# Patient Record
Sex: Female | Born: 1955 | Race: White | Hispanic: No | Marital: Married | Smoking: Former smoker
Health system: Southern US, Community
[De-identification: ages and names within clinical notes are randomized; demographics above are authoritative.]

## PROBLEM LIST (undated history)

## (undated) DIAGNOSIS — C801 Malignant (primary) neoplasm, unspecified: Secondary | ICD-10-CM

## (undated) DIAGNOSIS — F40240 Claustrophobia: Secondary | ICD-10-CM

## (undated) DIAGNOSIS — I10 Essential (primary) hypertension: Secondary | ICD-10-CM

## (undated) DIAGNOSIS — F419 Anxiety disorder, unspecified: Secondary | ICD-10-CM

## (undated) DIAGNOSIS — R011 Cardiac murmur, unspecified: Secondary | ICD-10-CM

## (undated) DIAGNOSIS — F329 Major depressive disorder, single episode, unspecified: Secondary | ICD-10-CM

## (undated) DIAGNOSIS — F32A Depression, unspecified: Secondary | ICD-10-CM

## (undated) DIAGNOSIS — K219 Gastro-esophageal reflux disease without esophagitis: Secondary | ICD-10-CM

## (undated) DIAGNOSIS — R7303 Prediabetes: Secondary | ICD-10-CM

## (undated) HISTORY — PX: ABLATION: SHX5711

## (undated) HISTORY — PX: TUBAL LIGATION: SHX77

## (undated) HISTORY — DX: Gastro-esophageal reflux disease without esophagitis: K21.9

---

## 2003-11-29 ENCOUNTER — Other Ambulatory Visit: Admission: RE | Admit: 2003-11-29 | Discharge: 2003-11-29 | Payer: Self-pay | Admitting: Obstetrics and Gynecology

## 2003-12-03 ENCOUNTER — Encounter: Admission: RE | Admit: 2003-12-03 | Discharge: 2003-12-03 | Payer: Self-pay | Admitting: Obstetrics and Gynecology

## 2005-06-14 ENCOUNTER — Other Ambulatory Visit: Admission: RE | Admit: 2005-06-14 | Discharge: 2005-06-14 | Payer: Self-pay | Admitting: Obstetrics and Gynecology

## 2006-05-13 ENCOUNTER — Encounter (INDEPENDENT_AMBULATORY_CARE_PROVIDER_SITE_OTHER): Payer: Self-pay | Admitting: *Deleted

## 2006-05-13 ENCOUNTER — Ambulatory Visit (HOSPITAL_COMMUNITY): Admission: RE | Admit: 2006-05-13 | Discharge: 2006-05-13 | Payer: Self-pay | Admitting: Obstetrics and Gynecology

## 2006-07-29 ENCOUNTER — Encounter: Admission: RE | Admit: 2006-07-29 | Discharge: 2006-07-29 | Payer: Self-pay | Admitting: Obstetrics and Gynecology

## 2006-12-31 HISTORY — PX: ABDOMINAL HYSTERECTOMY: SHX81

## 2007-09-02 ENCOUNTER — Encounter (INDEPENDENT_AMBULATORY_CARE_PROVIDER_SITE_OTHER): Payer: Self-pay | Admitting: Radiology

## 2007-09-02 ENCOUNTER — Other Ambulatory Visit: Admission: RE | Admit: 2007-09-02 | Discharge: 2007-09-02 | Payer: Self-pay | Admitting: Obstetrics and Gynecology

## 2007-09-02 ENCOUNTER — Encounter: Admission: RE | Admit: 2007-09-02 | Discharge: 2007-09-02 | Payer: Self-pay | Admitting: Obstetrics and Gynecology

## 2007-10-15 ENCOUNTER — Encounter: Admission: RE | Admit: 2007-10-15 | Discharge: 2007-10-15 | Payer: Self-pay | Admitting: Obstetrics and Gynecology

## 2007-12-09 ENCOUNTER — Ambulatory Visit (HOSPITAL_COMMUNITY): Admission: RE | Admit: 2007-12-09 | Discharge: 2007-12-11 | Payer: Self-pay | Admitting: Obstetrics and Gynecology

## 2007-12-09 ENCOUNTER — Encounter (INDEPENDENT_AMBULATORY_CARE_PROVIDER_SITE_OTHER): Payer: Self-pay | Admitting: Obstetrics and Gynecology

## 2008-08-30 ENCOUNTER — Encounter: Admission: RE | Admit: 2008-08-30 | Discharge: 2008-08-30 | Payer: Self-pay | Admitting: Obstetrics and Gynecology

## 2008-09-01 ENCOUNTER — Encounter: Admission: RE | Admit: 2008-09-01 | Discharge: 2008-09-01 | Payer: Self-pay | Admitting: Obstetrics and Gynecology

## 2009-04-13 ENCOUNTER — Encounter: Admission: RE | Admit: 2009-04-13 | Discharge: 2009-04-13 | Payer: Self-pay | Admitting: Obstetrics and Gynecology

## 2009-12-31 HISTORY — PX: KNEE ARTHROSCOPY: SHX127

## 2009-12-31 HISTORY — PX: STAPEDECTOMY: SHX2435

## 2011-05-15 NOTE — Op Note (Signed)
NAMEKENZEE, Beth Williams               ACCOUNT NO.:  [id number]   MEDICAL RECORD NO.:  [id number]          PATIENT TYPE:  INP   LOCATION:  9308                          FACILITY:  [facility]   PHYSICIAN:  Michelle L. Grewal, M.D.DATE OF BIRTH:  01/04/1956   DATE OF PROCEDURE:  12/09/2007  DATE OF DISCHARGE:                               OPERATIVE REPORT   PREOPERATIVE DIAGNOSIS:  Postmenopausal bleeding.   POSTOPERATIVE DIAGNOSIS:  Postmenopausal bleeding.   PROCEDURE:  Laparoscopically-assisted vaginal hysterectomy and bilateral  salpingo-oophorectomy.   SURGICAL HISTORY:  Marcelino Duster L. Vincente Poli, M.D.   ANESTHESIA:  General.   ASSISTANT:  Freddy Finner, M.D.   ESTIMATED BLOOD LOSS:  100 mL.   DRAINS:  Foley catheter.   PATHOLOGY:  Uterus with cervix, tubes and ovaries.   PROCEDURE:  The patient was taken to the operating room after informed  consent was obtained.  She was then prepped and draped in the usual  sterile fashion after she was intubated.  A Foley catheter was inserted.  The uterus was grasped with a uterine manipulator.  Attention was turned  to the abdomen, where a small infraumbilical incision was made, the  Veress needle was inserted and pneumoperitoneum was performed.  We then  removed the Veress needle and the 11-mm trocar was inserted.  The  laparoscope was introduced through the trocar sheath.  The entire pelvis  was inspected and noted to be normal with no adhesions.  The secondary 5-  mm trocar was placed under direct visualization in the suprapubic  region.  We then identified our ovaries, which appeared normal,  identified the ureter, which was well below the IP ligament on both  sides.  Grasped the ovary initially on the right side, lifted it up.  Using the Gyrus, I then placed it just beneath the ovary, burned the IP  ligament, and transected it.  I did this and went through the  mesosalpinx all the way through the round ligament.  This was done with  no  bleeding at all.  I did this on the left side of the pelvis in an  identical fashion.  After we did this portion of the surgery, we then  turned our attention vaginally.  Pneumoperitoneum, of course, had been  released and the trocar sheaths were left in, and I placed a weighted  speculum in the vagina.  A circumferential incision was made around the  cervix and the posterior cul-de-sac was entered sharply.  A weighted  speculum was placed in the cul-de-sac.  I then turned my attention  anteriorly, went in anteriorly using Metzenbaum scissors.  We then  placed curved Heaney clamps just across the uterosacral-cardinal  ligaments on either side.  Each specimen pedicle was cut and suture  ligated using 0 Vicryl suture.  We walked our way up the broad ligament.  Each pedicle was secured using a suture ligature of 0 Vicryl suture.  Once we reached the level of the fundus, the uterus was retroflexed and  the entire specimen was removed.  There was no bleeding noted.  The  posterior cuff was closed  from 3 to 9 o'clock in a continuous running  locked stitch using 0 Vicryl suture and the cuff was then closed  completely anterior to posterior using 0 Vicryl suture in a running  stitch.  At this point we then turned our attention back up to the  abdomen, where I replaced the pneumoperitoneum.  The patient was gently  placed in Trendelenburg position and the pelvis was irrigated.  No  bleeding was noted at all.  The trocars were removed after the  pneumoperitoneum was released.  The incisions were closed with Dermabond  skin adhesive.  All sponge, lap and instrument counts were correct x2.  The patient was extubated and went to the recovery room in stable  condition.      Michelle L. Vincente Poli, M.D.  Electronically Signed     MLG/MEDQ  D:  12/09/2007  T:  12/09/2007  Job:  [id number]

## 2011-05-18 NOTE — Op Note (Signed)
Beth Williams, Beth Williams               ACCOUNT NO.:  [id number]   MEDICAL RECORD NO.:  [id number]          PATIENT TYPE:  AMB   LOCATION:  SDC                           [facility]  [facility]   PHYSICIAN:  Michelle L. Grewal, M.D.DATE OF BIRTH:  March 06, 1956   DATE OF PROCEDURE:  05/13/2006  DATE OF DISCHARGE:                                 OPERATIVE REPORT   PREOPERATIVE DIAGNOSIS:  Postmenopausal bleeding, hormone replacement  therapy.   POSTOPERATIVE DIAGNOSIS:  Postmenopausal bleeding, hormone replacement  therapy.   PROCEDURE:  D&C hysteroscopy, ThermaChoice endometrial ablation.   SURGEON:  Michelle L. Vincente Poli, M.D.   ANESTHESIA:  MAC with paracervical.   PROCEDURE:  The patient was taken to the operating room.  She was given  sedation and placed in the lithotomy position.  She was prepped and draped  in the usual sterile fashion.  In-and-out catheter was used.  A speculum was  placed in the vagina, the cervix was grasped with a tenaculum. The  paracervical block is performed in the standard fashion.  The cervical  internal os is gently dilated with Shawnie Pons dilators.  The diagnostic  hysteroscope is inserted into the uterine cavity. The uterine cavity is  visualized in its entirety. The ostia were visualized and the endometrium is  extremely atrophic.  No intracavitary lesions were seen. The cavity was  noted to be very large. The diagnostic hysteroscope was removed from the  uterus.  The ThermaChoice III balloon was inserted and ThermaChoice  endometrial ablation was performed according to the manufacturer's  specifications for an 8 cycle. The intact balloon was removed after the  endometrial ablation cycle. All sponge, lap and instrument counts were  correct x2.  Very minimal bleeding was and with the procedure. Prior to the  ablation, I did do a small curettage and that specimen was sent to  pathology. All sponge, lap and instrument counts were correct x2.  The  patient went to the  recovery room in stable condition.      Michelle L. Vincente Poli, M.D.  Electronically Signed     MLG/MEDQ  D:  05/13/2006  T:  05/13/2006  Job:  [id number]

## 2011-05-18 NOTE — Discharge Summary (Signed)
Beth Williams, Beth Williams               ACCOUNT NO.:  [id number]   MEDICAL RECORD NO.:  [id number]          PATIENT TYPE:  OIB   LOCATION:  9399                          [facility]   PHYSICIAN:  Michelle L. Grewal, M.D.DATE OF BIRTH:  1956/05/09   DATE OF ADMISSION:  12/09/2007  DATE OF DISCHARGE:  12/11/2007                               DISCHARGE SUMMARY   PREOP DIAGNOSIS:  Post menopausal bleeding.   POSTOP DIAGNOSIS:  Post menopausal bleeding.   HISTORY:  This patient is a 55 year old female who presents for LAVH and  BSO.  She had had a problem with postmenopausal bleeding unresponsive to  hormone therapy or endometrial ablation.  She has also had severe  cramping.  EBL at the time of surgery was 100 mL.  She did very well.  By postop day #1 she was ambulating; she had good pain control, and her  postop day #1 hemoglobin was 11.0.  She was discharged home in good  condition on postop day #2.  She was given ibuprofen to take as needed  for pain, as well as, Darvocet N 100.  She was advised to call if she  has any heavy vaginal bleeding, fever, nausea, vomiting, or severe  abdominal pain.  She was advised no sex for 6 weeks, no heavy lifting  for 1-2 weeks, no driving for 1 week.  She will follow up with me, Dr.  Vincente Poli, in 1 week.      Michelle L. Vincente Poli, M.D.  Electronically Signed     MLG/MEDQ  D:  01/05/2008  T:  01/06/2008  Job:  [id number]

## 2011-07-21 DIAGNOSIS — N649 Disorder of breast, unspecified: Secondary | ICD-10-CM | POA: Insufficient documentation

## 2011-10-08 LAB — CBC
HCT: 32 — ABNORMAL LOW
HCT: 37.9
Hemoglobin: 11 — ABNORMAL LOW
Hemoglobin: 12.9
MCHC: 34
MCHC: 34.3
MCV: 83.3
MCV: 83.9
Platelets: 243
Platelets: 293
RBC: 3.81 — ABNORMAL LOW
RBC: 4.55
RDW: 14.4
RDW: 14.5
WBC: 5.5
WBC: 5.9

## 2011-10-08 LAB — BASIC METABOLIC PANEL
BUN: 7
CO2: 27
Calcium: 9
Chloride: 107
Creatinine, Ser: 0.73
GFR calc Af Amer: >60
GFR calc non Af Amer: >60
Glucose, Bld: 97
Potassium: 3.4 — ABNORMAL LOW
Sodium: 141

## 2012-02-06 ENCOUNTER — Other Ambulatory Visit: Payer: Self-pay | Admitting: Obstetrics and Gynecology

## 2012-02-06 DIAGNOSIS — N63 Unspecified lump in unspecified breast: Secondary | ICD-10-CM

## 2012-02-13 ENCOUNTER — Ambulatory Visit
Admission: RE | Admit: 2012-02-13 | Discharge: 2012-02-13 | Disposition: A | Discharge status: Home / Self Care | Payer: BC Managed Care – PPO | Source: Ambulatory Visit | Attending: Obstetrics and Gynecology | Admitting: Obstetrics and Gynecology

## 2012-02-13 ENCOUNTER — Other Ambulatory Visit: Payer: Self-pay | Admitting: Obstetrics and Gynecology

## 2012-02-13 DIAGNOSIS — N63 Unspecified lump in unspecified breast: Secondary | ICD-10-CM

## 2012-02-21 ENCOUNTER — Ambulatory Visit (INDEPENDENT_AMBULATORY_CARE_PROVIDER_SITE_OTHER): Payer: BC Managed Care – PPO | Admitting: Surgery

## 2012-02-21 ENCOUNTER — Encounter (INDEPENDENT_AMBULATORY_CARE_PROVIDER_SITE_OTHER): Payer: Self-pay | Admitting: Surgery

## 2012-02-21 VITALS — BP 152/106 | HR 98 | Temp 97.5°F | Resp 12 | Ht 61.0 in | Wt 197.0 lb

## 2012-02-21 DIAGNOSIS — N649 Disorder of breast, unspecified: Secondary | ICD-10-CM

## 2012-02-21 DIAGNOSIS — D2370 Other benign neoplasm of skin of unspecified lower limb, including hip: Secondary | ICD-10-CM

## 2012-02-21 NOTE — Patient Instructions (Signed)
You may remove the dressing tomorrow and shower. We will call when we receive the report from the biopsy

## 2012-02-21 NOTE — Progress Notes (Signed)
  CC: Abnormal area left areola HPI: This patient comes over from Dr.Michelle Grewal with an abnormality of the left areolar area. The patient notes that there has been a red raised area since June or July. She thinks is gotten a little bit larger. It never drained or appeared infected. It has not been painful. She's had no breast associated symptoms. She had a mammogram which was negative. We are asked to see her to evaluate this lesion.   ROS: Essentially negative.  MEDS: No current outpatient prescriptions on file prior to visit.     ALLERGIES:  Allergies  Allergen Reactions  . Latex   . Aspirin Swelling  . Shellfish Allergy   . Tamiflu Rash     PE General: The patient is alert oriented and healthy appearing. Breasts: The breasts are symmetric in size and shape. The right breast is completely normal. The left breast shows a red raised area mid way between the base of the nipple and the areolar edge in the 6:00 position. It is somewhat rough on the surface. There is no underlying mass.  Data Reviewed I have reviewed the notes from her physician's office and he mammogram and ultrasound reports  Assessment Skin lesion left areola uncertain etiology  Plan I recommended that we do a punch biopsy. The systolic. The involving the breast per se but the skin of the areola. I'm concerned this may represent a small skin cancer. I discussed that with her and she was agreeable.  Procedure note: The area in question was anesthetized with 1% Xylocaine with epinephrine. I waited a few minutes. The skin was prepped with Betadine and a 3 mm punch taken. There was no significant bleeding the patient tolerated the procedure well.  We will call her with the results and make further plans based on that.

## 2012-02-26 ENCOUNTER — Telehealth (INDEPENDENT_AMBULATORY_CARE_PROVIDER_SITE_OTHER): Payer: Self-pay | Admitting: General Surgery

## 2012-02-26 NOTE — Telephone Encounter (Signed)
Left message for patient to call me for results

## 2012-02-26 NOTE — Telephone Encounter (Signed)
Message copied by Liliana Cline on Tue Feb 26, 2012  8:54 AM ------      Message from: Currie Paris      Created: Tue Feb 26, 2012  5:44 AM       Tell her this is some form of a dermatitis. She will need to follow up with her primary care or a dermatologist. It may respond to a steroid cream, but I'm not sure if that is the appropriate treatment

## 2012-02-26 NOTE — Telephone Encounter (Signed)
Patient aware of path results. Will follow up with [place] [facility].

## 2012-03-13 ENCOUNTER — Other Ambulatory Visit: Payer: Self-pay | Admitting: Obstetrics and Gynecology

## 2013-03-24 ENCOUNTER — Other Ambulatory Visit: Payer: Self-pay | Admitting: Obstetrics and Gynecology

## 2014-04-09 ENCOUNTER — Other Ambulatory Visit: Payer: Self-pay | Admitting: Obstetrics and Gynecology

## 2014-10-19 ENCOUNTER — Other Ambulatory Visit: Payer: Self-pay

## 2014-12-31 HISTORY — PX: BREAST LUMPECTOMY: SHX2

## 2015-02-03 ENCOUNTER — Other Ambulatory Visit: Payer: Self-pay | Admitting: Obstetrics and Gynecology

## 2015-02-03 DIAGNOSIS — N644 Mastodynia: Secondary | ICD-10-CM

## 2015-02-11 ENCOUNTER — Ambulatory Visit
Admission: RE | Admit: 2015-02-11 | Discharge: 2015-02-11 | Disposition: A | Discharge status: Home / Self Care | Payer: BLUE CROSS/BLUE SHIELD | Source: Ambulatory Visit | Attending: Obstetrics and Gynecology | Admitting: Obstetrics and Gynecology

## 2015-02-11 ENCOUNTER — Encounter (INDEPENDENT_AMBULATORY_CARE_PROVIDER_SITE_OTHER): Payer: Self-pay

## 2015-02-11 DIAGNOSIS — N644 Mastodynia: Secondary | ICD-10-CM

## 2015-04-14 ENCOUNTER — Other Ambulatory Visit: Payer: Self-pay | Admitting: Obstetrics and Gynecology

## 2015-04-15 LAB — CYTOLOGY - PAP

## 2015-04-19 ENCOUNTER — Other Ambulatory Visit: Payer: Self-pay | Admitting: Obstetrics and Gynecology

## 2015-04-19 DIAGNOSIS — R928 Other abnormal and inconclusive findings on diagnostic imaging of breast: Secondary | ICD-10-CM

## 2015-04-22 ENCOUNTER — Other Ambulatory Visit: Payer: Self-pay | Admitting: Obstetrics and Gynecology

## 2015-04-22 ENCOUNTER — Ambulatory Visit
Admission: RE | Admit: 2015-04-22 | Discharge: 2015-04-22 | Disposition: A | Discharge status: Home / Self Care | Payer: BLUE CROSS/BLUE SHIELD | Source: Ambulatory Visit | Attending: Obstetrics and Gynecology | Admitting: Obstetrics and Gynecology

## 2015-04-22 DIAGNOSIS — R921 Mammographic calcification found on diagnostic imaging of breast: Secondary | ICD-10-CM

## 2015-04-22 DIAGNOSIS — R928 Other abnormal and inconclusive findings on diagnostic imaging of breast: Secondary | ICD-10-CM

## 2015-04-28 ENCOUNTER — Other Ambulatory Visit: Payer: Self-pay | Admitting: Obstetrics and Gynecology

## 2015-04-28 DIAGNOSIS — R921 Mammographic calcification found on diagnostic imaging of breast: Secondary | ICD-10-CM

## 2015-04-29 ENCOUNTER — Ambulatory Visit
Admission: RE | Admit: 2015-04-29 | Discharge: 2015-04-29 | Disposition: A | Discharge status: Home / Self Care | Payer: BLUE CROSS/BLUE SHIELD | Source: Ambulatory Visit | Attending: Obstetrics and Gynecology | Admitting: Obstetrics and Gynecology

## 2015-04-29 DIAGNOSIS — R921 Mammographic calcification found on diagnostic imaging of breast: Secondary | ICD-10-CM

## 2015-04-29 IMAGING — MG MM DIAG BREAST TOMO UNI RIGHT
4 series · 4 of 12 positions shown · non-contrast
Comparison: Previous exam(s).

CLINICAL DATA: Status post stereotactic core biopsy right breast
calcifications with architectural distortion.

EXAM:
DIAGNOSTIC RIGHT MAMMOGRAM POST STEREOTACTIC BIOPSY

[R ML]
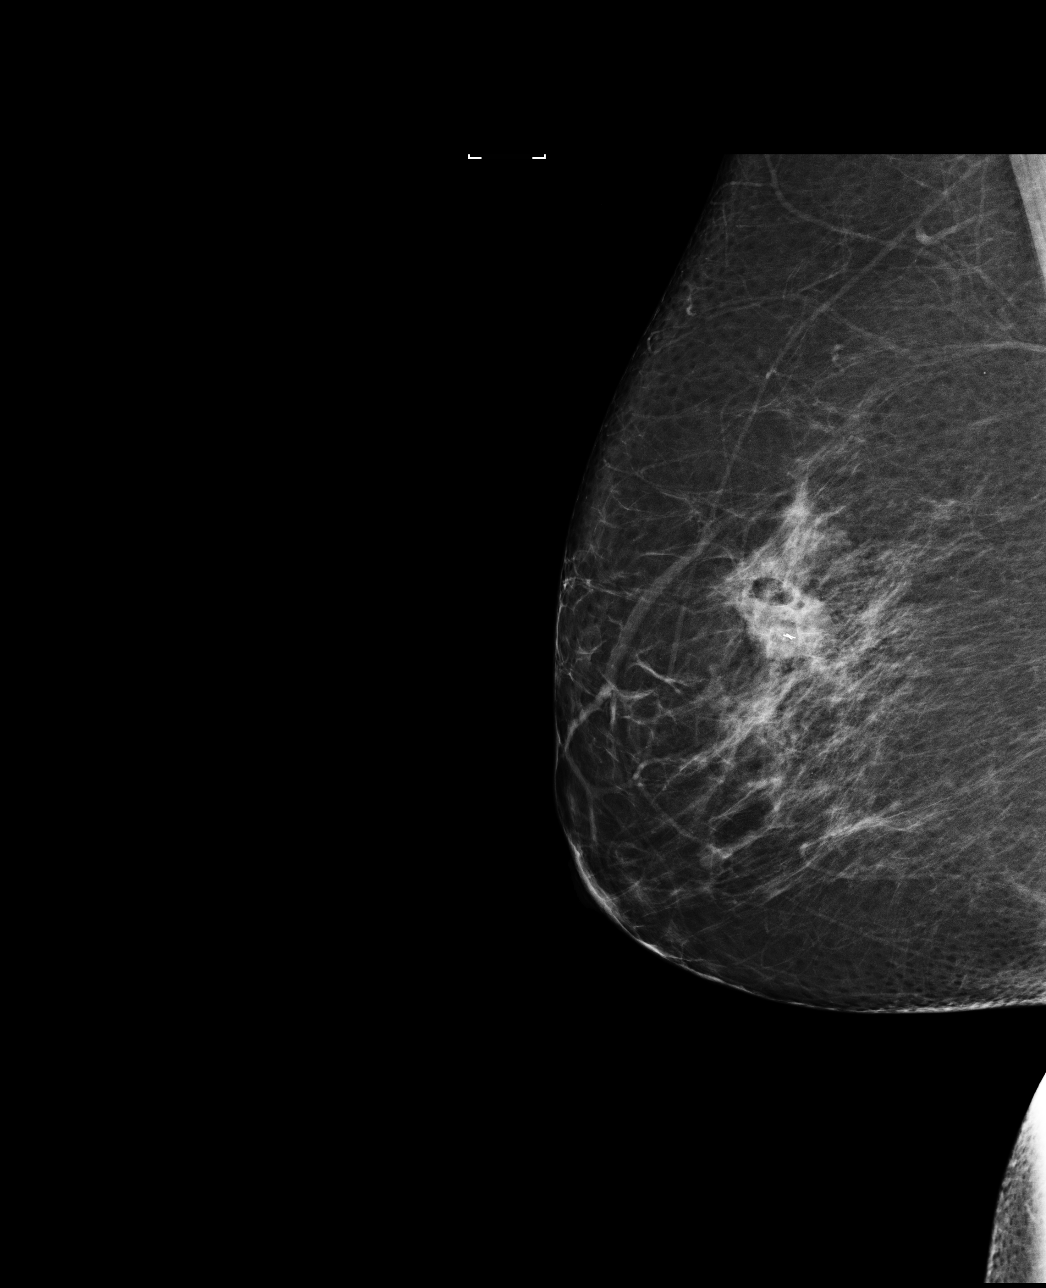

[R CC]
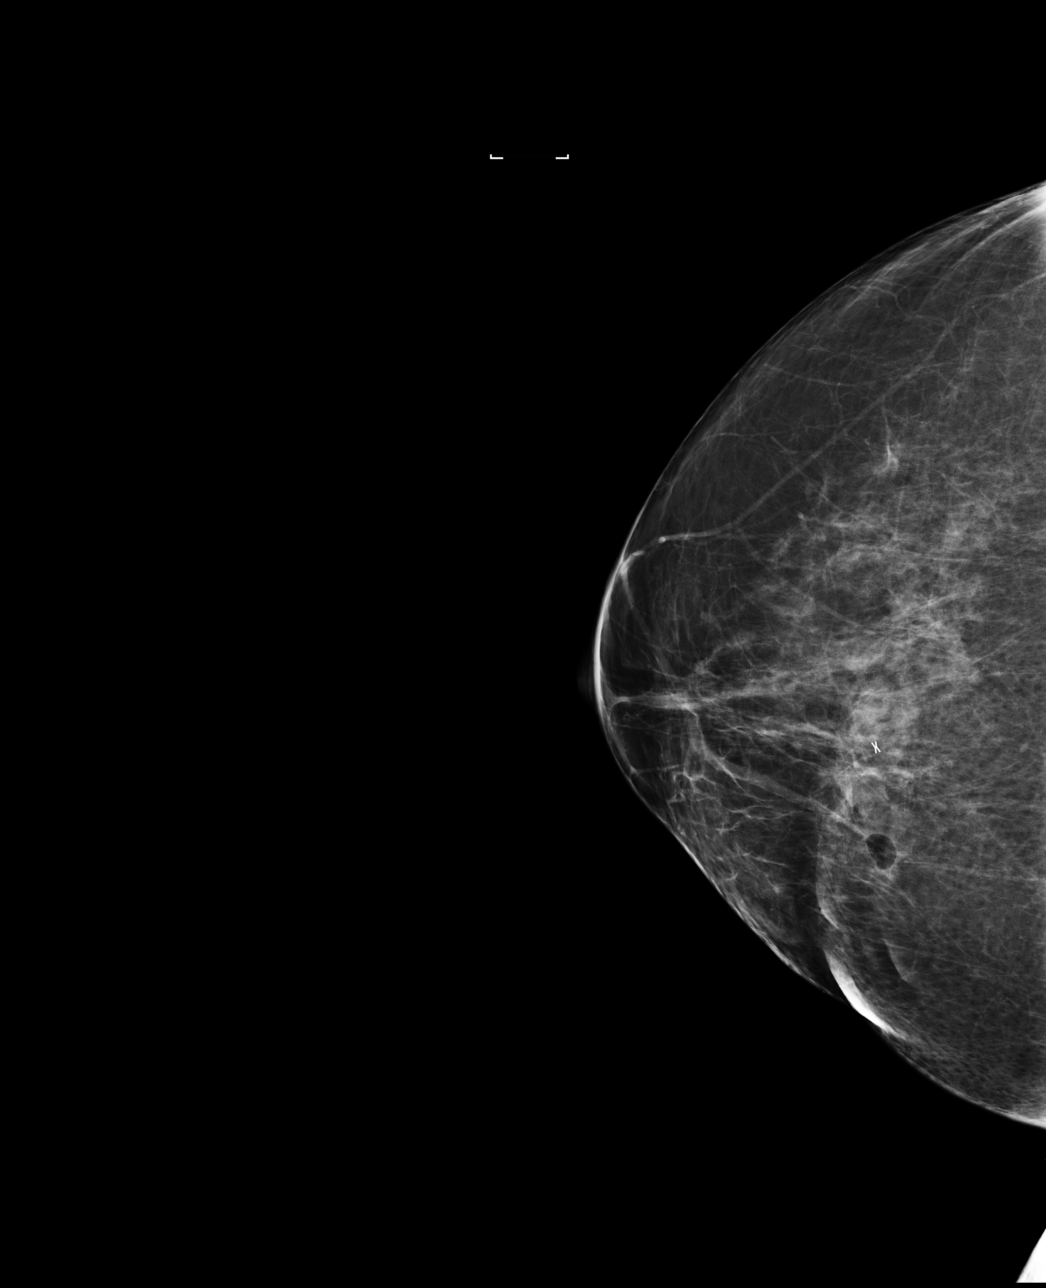

[R CC tomo · tomo slice 40/79.0]
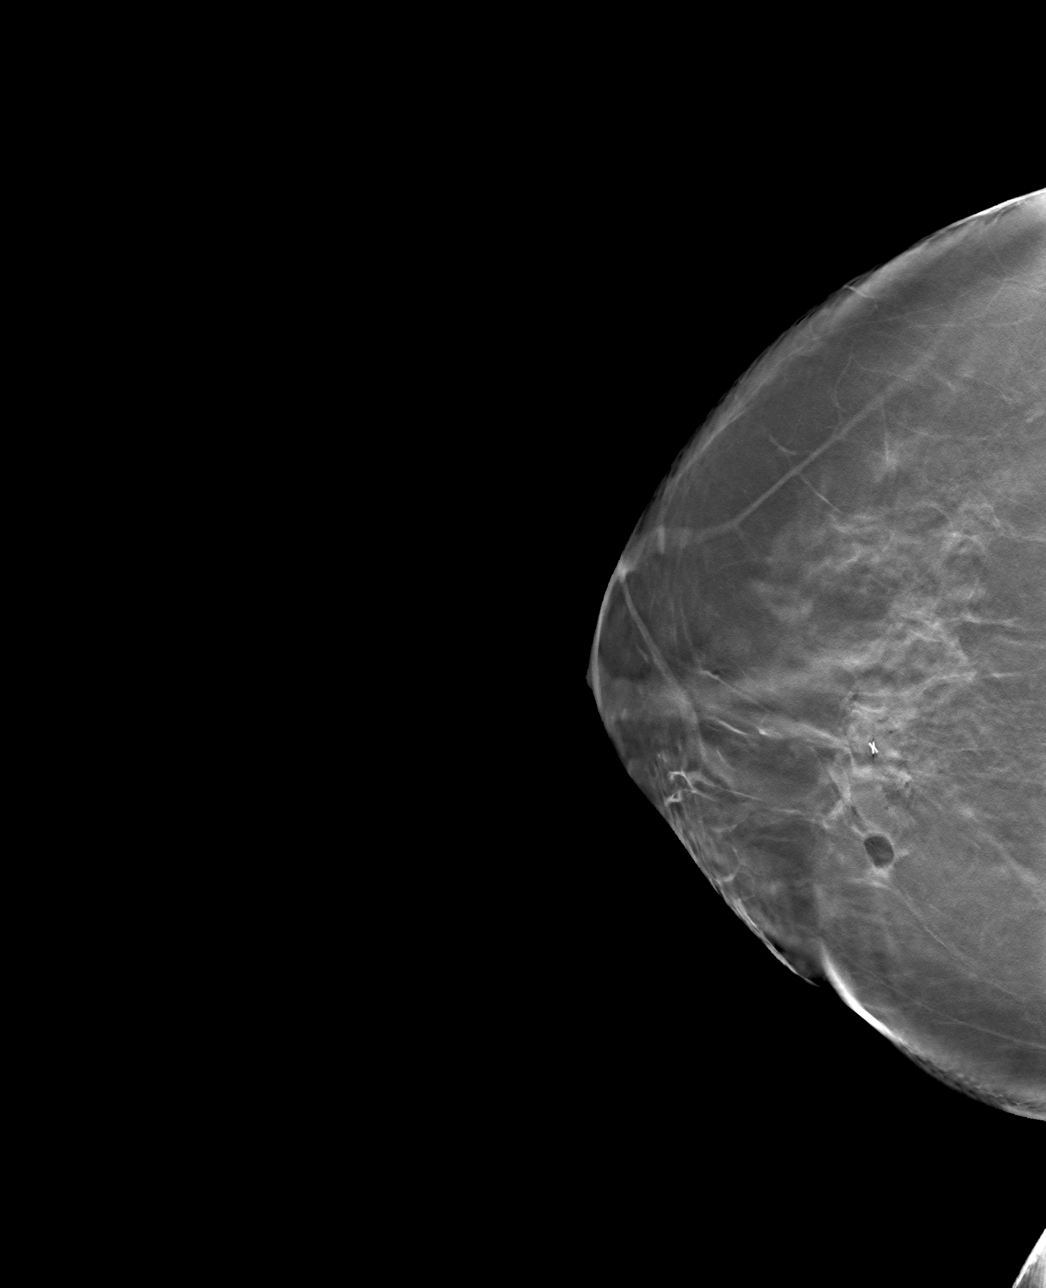

[R ML tomo · tomo slice 43/85.0]
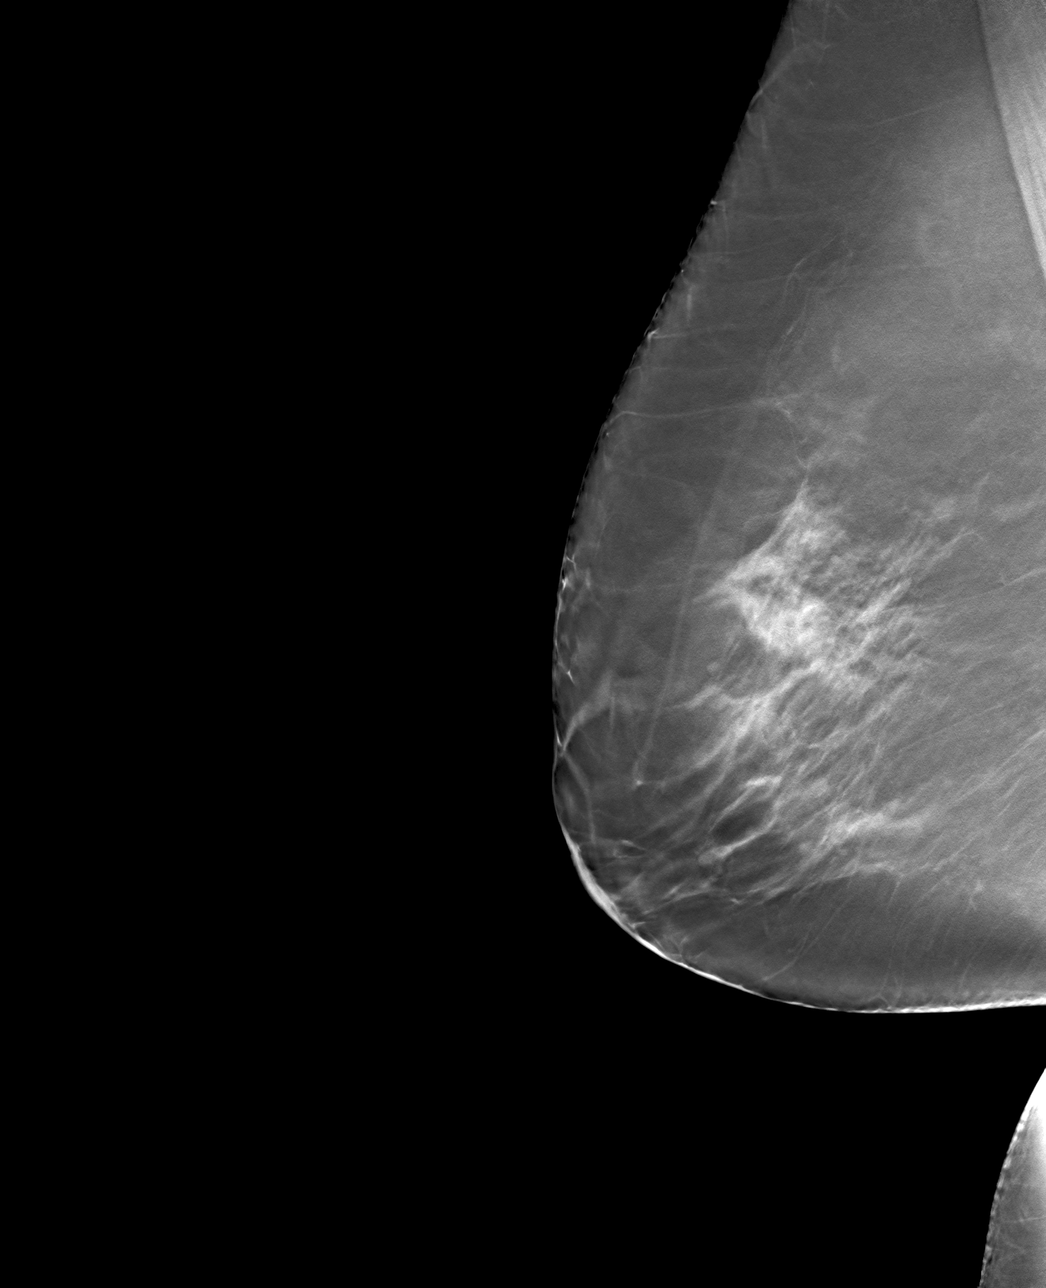

[4 of 12 positions shown; findings below may reference images not displayed]

FINDINGS: Mammographic images were obtained following right breast
stereotactic guided biopsy of calcification with architectural
distal. Tomographic CC and lateral views of the right breast
demonstrate x shaped biopsy clip in the area of concern.
IMPRESSION: Post biopsy mammogram demonstrating biopsy clip in the area of
concern.

Final Assessment: Post Procedure Mammograms for Marker Placement

## 2015-05-06 ENCOUNTER — Ambulatory Visit: Payer: Self-pay | Admitting: Surgery

## 2015-05-06 DIAGNOSIS — N6310 Unspecified lump in the right breast, unspecified quadrant: Secondary | ICD-10-CM

## 2015-05-06 NOTE — H&P (Signed)
Beth Williams 05/06/2015 11:03 AM Location: [facility] Patient #: [id number] DOB: 03/12/1956 Married / Language: English / Race: White Female History of Present Illness Beth Williams A. Beth Minetti MD; 05/06/2015 12:41 PM) Patient words: New Patient  PT SENT AT [facility] RIGHT BREAST MAMMOGRAPHIC ABNORMALITY. tHIS WAS PICKED UP ON MAMMOGRAM AND BX SHOWED SCLEROSING LESION. nO MASS, PAIN OR NIPPLE DISCHARGE.  CLINICAL DATA: Screening recall for possible right breast distortion.  EXAM: DIGITAL DIAGNOSTIC RIGHT MAMMOGRAM WITH 3D TOMOSYNTHESIS AND CAD  RIGHT BREAST ULTRASOUND  COMPARISON: Previous exams.  ACR Breast Density Category c: The breast tissue is heterogeneously dense, which may obscure small masses.  FINDINGS: CC tomosynthesis, true lateral tomosynthesis, and spot compression MLO tomosynthesis was performed of the right breast. In addition, spot compression magnification views were performed over the inner slightly upper right breast. The initially questioned possible distortion in the slightly inner slightly upper right breast appears to persist on the additional images. There are faint microcalcifications associated with the distortion spanning a distance of approximately 1.2 cm.  Mammographic images were processed with CAD.  Physical examination of the inner right breast does not reveal any palpable masses.  Targeted ultrasound of the right breast was performed. No discrete masses or abnormalities are seen, only normal appearing heterogeneous fibroglandular tissue is visualized. The entire central and inner right breast was scanned.  IMPRESSION: Suspicious right breast distortion seen in the slightly inner slightly upper right breast on mammography only, with associated microcalcifications.  RECOMMENDATION: Stereotactic guided biopsy of the calcifications in the slightly inner slightly upper right breast in the region of distortion  is recommended. This is scheduled for 04/29/2015 8 a.m.  I have discussed the findings and recommendations with the patient. Results were also provided in writing at the conclusion of the visit. If applicable, a reminder letter will be sent to the patient regarding the next appointment.  BI-RADS CATEGORY 4: Suspicious.   Electronically Signed By: Beth Williams M.D. On: 04/22/2015 10:30        Breast, right, needle core biopsy, medial - SCLEROSING LESION WITH FIBROCYSTIC CHANGES, ADENOSIS AND ASSOCIATED CALCIFICATIONS. - NO ATYPIA OR MALIGNANCY IDENTIFIED. - SEE COMMENT. CLINICAL DATA: Status post stereotactic core biopsy right breast calcifications with architectural distortion.  EXAM: DIAGNOSTIC RIGHT MAMMOGRAM POST STEREOTACTIC BIOPSY  COMPARISON: Previous exam(s).  FINDINGS: Mammographic images were obtained following right breast stereotactic guided biopsy of calcification with architectural distal. Tomographic CC and lateral views of the right breast demonstrate x shaped biopsy clip in the area of concern.  IMPRESSION: Post biopsy mammogram demonstrating biopsy clip in the area of concern.  Final Assessment: Post Procedure Mammograms for Marker Placement.  The patient is a 59 year old female   Other Problems Beth Crate, LPN; 01/08/2992 71:69 AM) Anxiety Disorder Arthritis Asthma Back Pain Gastroesophageal Reflux Disease Heart murmur High blood pressure Oophorectomy  Past Surgical History Beth Crate, LPN; 06/06/8937 10:17 AM) Breast Biopsy Left. Cesarean Section - Multiple Hysterectomy (not due to cancer) - Complete Knee Surgery Right. Oral Surgery Tonsillectomy  Diagnostic Studies History Beth Crate, LPN; 04/30/257 52:77 AM) Colonoscopy 5-10 years ago Mammogram within last year Pap Smear 1-5 years ago  Allergies Beth Crate, LPN; 08/01/4234 36:14 AM) Latex Shellfish Contrast Media Ready-Box *MEDICAL  DEVICES AND SUPPLIES* Aspirin *ANALGESICS - NonNarcotic* Tamiflu *ANTIVIRALS* Adhesive Tape  Medication History Beth Crate, LPN; 04/03/1539 08:67 AM) Ruthe Mannan (200-5MCG/ACT Aerosol, Inhalation) Active. Oxybutynin Chloride ER (5MG  Tablet ER 24HR, Oral as needed) Active. Restasis (0.05% Emulsion, Ophthalmic daily)  Active. Singulair (10MG  Tablet, Oral daily) Active. Omeprazole (20MG  Capsule DR, Oral daily) Active. Allopurinol (100MG  Tablet, Oral daily) Active. Hydrochlorothiazide (12.5MG  Capsule, Oral daily) Active.  Social History Beth Crate, LPN; 05/03/5624 63:89 AM) Caffeine use Coffee, Tea. No alcohol use No drug use Tobacco use Former smoker.  Family History Beth Crate, LPN; 03/07/3427 76:81 AM) Cancer Mother. Diabetes Mellitus Family Members In General, Mother. Hypertension Mother. Respiratory Condition Mother. Thyroid problems Mother.  Pregnancy / Birth History Beth Crate, LPN; 01/04/7261 03:55 AM) Age of menopause 51-55 Contraceptive History Oral contraceptives. Gravida 2 Maternal age 10-25 Para 2     Review of Systems Clarise Cruz [facility] LPN; 09/06/4162 84:53 AM) General Not Present- Appetite Loss, Chills, Fatigue, Fever, Night Sweats, Weight Gain and Weight Loss. Skin Present- Dryness. Not Present- Change in Wart/Mole, Hives, Jaundice, New Lesions, Non-Healing Wounds, Rash and Ulcer. HEENT Present- Seasonal Allergies and Wears glasses/contact lenses. Not Present- Earache, Hearing Loss, Hoarseness, Nose Bleed, Oral Ulcers, Ringing in the Ears, Sinus Pain, Sore Throat, Visual Disturbances and Yellow Eyes. Cardiovascular Present- Palpitations, Rapid Heart Rate and Swelling of Extremities. Not Present- Chest Pain, Difficulty Breathing Lying Down, Leg Cramps and Shortness of Breath. Gastrointestinal Not Present- Abdominal Pain, Bloating, Bloody Stool, Change in Bowel Habits, Chronic diarrhea, Constipation, Difficulty Swallowing, Excessive gas, Gets  full quickly at meals, Hemorrhoids, Indigestion, Nausea, Rectal Pain and Vomiting. Female Genitourinary Not Present- Frequency, Nocturia, Painful Urination, Pelvic Pain and Urgency. Musculoskeletal Not Present- Back Pain, Joint Pain, Joint Stiffness, Muscle Pain, Muscle Weakness and Swelling of Extremities. Neurological Not Present- Decreased Memory, Fainting, Headaches, Numbness, Seizures, Tingling, Tremor, Trouble walking and Weakness. Psychiatric Not Present- Anxiety, Bipolar, Change in Sleep Pattern, Depression, Fearful and Frequent crying. Endocrine Not Present- Cold Intolerance, Excessive Hunger, Hair Changes, Heat Intolerance, Hot flashes and New Diabetes. Hematology Not Present- Easy Bruising, Excessive bleeding, Gland problems, HIV and Persistent Infections.  Vitals Clarise Cruz [facility] LPN; 06/03/6802 21:22 AM) 05/06/2015 11:04 AM Weight: 205.5 lb Height: 61in Body Surface Area: 2 m Body Mass Index: 38.83 kg/m Temp.: 98.56F(Oral)  Pulse: 69 (Regular)  BP: 142/78 (Sitting, Left Arm, Standard)     Physical Exam (Sufyan Meidinger A. Brittane Grudzinski MD; 05/06/2015 12:41 PM)  General Mental Status-Alert. General Appearance-Consistent with stated age. Hydration-Well hydrated. Voice-Normal.  Head and Neck Head-normocephalic, atraumatic with no lesions or palpable masses. Trachea-midline. Thyroid Gland Characteristics - normal size and consistency.  Eye Eyeball - Bilateral-Extraocular movements intact. Sclera/Conjunctiva - Bilateral-No scleral icterus.  Chest and Lung Exam Chest and lung exam reveals -quiet, even and easy respiratory effort with no use of accessory muscles and on auscultation, normal breath sounds, no adventitious sounds and normal vocal resonance. Inspection Chest Wall - Normal. Back - normal.  Breast Breast - Left-Symmetric, Non Tender, No Biopsy scars, no Dimpling, No Inflammation, No Lumpectomy scars, No Mastectomy scars, No Peau d'  Orange. Breast - Right-Symmetric, Non Tender, No Biopsy scars, no Dimpling, No Inflammation, No Lumpectomy scars, No Mastectomy scars, No Peau d' Orange. Breast Lump-No Palpable Breast Mass. Note: [place] RIGHT BREAST   Cardiovascular Cardiovascular examination reveals -normal heart sounds, regular rate and rhythm with no murmurs and normal pedal pulses bilaterally.  Abdomen Inspection Inspection of the abdomen reveals - No Hernias. Skin - Scar - no surgical scars. Palpation/Percussion Palpation and Percussion of the abdomen reveal - Soft, Non Tender, No Rebound tenderness, No Rigidity (guarding) and No hepatosplenomegaly. Auscultation Auscultation of the abdomen reveals - Bowel sounds normal.  Neurologic Neurologic evaluation reveals -alert and oriented x 3 with no impairment of  recent or remote memory. Mental Status-Normal.  Musculoskeletal Normal Exam - Left-Upper Extremity Strength Normal and Lower Extremity Strength Normal. Normal Exam - Right-Upper Extremity Strength Normal and Lower Extremity Strength Normal.  Lymphatic Head & Neck  General Head & Neck Lymphatics: Bilateral - Description - Normal. Axillary  General Axillary Region: Bilateral - Description - Normal. Tenderness - Non Tender. Femoral & Inguinal  Generalized Femoral & Inguinal Lymphatics: Bilateral - Description - Normal. Tenderness - Non Tender.    Assessment & Plan (Lasonya Hubner A. Matalyn Nawaz MD; 05/06/2015 12:41 PM)  BREAST MASS, RIGHT (611.72  N63) Impression: recommend excision right breast mass for sclerosing lesion small but finite risk of malignancy Risk of lumpectomy include bleeding, infection, seroma, more surgery, use of seed/wire, wound care, cosmetic deformity and the need for other treatments, death , blood clots, death. Pt agrees to proceed.  Current Plans Pt Education - [facility] Breast Biopsy [facility]

## 2015-05-12 ENCOUNTER — Other Ambulatory Visit: Payer: Self-pay | Admitting: Surgery

## 2015-05-12 DIAGNOSIS — N6310 Unspecified lump in the right breast, unspecified quadrant: Secondary | ICD-10-CM

## 2015-05-17 ENCOUNTER — Encounter (HOSPITAL_BASED_OUTPATIENT_CLINIC_OR_DEPARTMENT_OTHER): Payer: Self-pay | Admitting: *Deleted

## 2015-05-25 ENCOUNTER — Ambulatory Visit
Admission: RE | Admit: 2015-05-25 | Discharge: 2015-05-25 | Disposition: A | Discharge status: Home / Self Care | Payer: BLUE CROSS/BLUE SHIELD | Source: Ambulatory Visit | Attending: Surgery | Admitting: Surgery

## 2015-05-25 ENCOUNTER — Ambulatory Visit (HOSPITAL_BASED_OUTPATIENT_CLINIC_OR_DEPARTMENT_OTHER): Admission: RE | Admit: 2015-05-25 | Payer: BLUE CROSS/BLUE SHIELD | Source: Ambulatory Visit | Admitting: Surgery

## 2015-05-25 DIAGNOSIS — N6310 Unspecified lump in the right breast, unspecified quadrant: Secondary | ICD-10-CM

## 2015-05-25 HISTORY — DX: Cardiac murmur, unspecified: R01.1

## 2015-05-25 HISTORY — DX: Malignant (primary) neoplasm, unspecified: C80.1

## 2015-05-25 HISTORY — DX: Essential (primary) hypertension: I10

## 2015-05-25 HISTORY — DX: Claustrophobia: F40.240

## 2015-05-25 SURGERY — BREAST LUMPECTOMY WITH RADIOACTIVE SEED LOCALIZATION
Anesthesia: General | Laterality: Right

## 2015-06-03 ENCOUNTER — Encounter (HOSPITAL_BASED_OUTPATIENT_CLINIC_OR_DEPARTMENT_OTHER): Payer: Self-pay | Admitting: *Deleted

## 2015-06-03 ENCOUNTER — Ambulatory Visit
Admission: RE | Admit: 2015-06-03 | Discharge: 2015-06-03 | Disposition: A | Discharge status: Home / Self Care | Payer: BLUE CROSS/BLUE SHIELD | Source: Ambulatory Visit | Attending: Surgery | Admitting: Surgery

## 2015-06-03 DIAGNOSIS — N6310 Unspecified lump in the right breast, unspecified quadrant: Secondary | ICD-10-CM

## 2015-06-07 ENCOUNTER — Ambulatory Visit
Admission: RE | Admit: 2015-06-07 | Discharge: 2015-06-07 | Disposition: A | Discharge status: Home / Self Care | Payer: BLUE CROSS/BLUE SHIELD | Source: Ambulatory Visit | Attending: Surgery | Admitting: Surgery

## 2015-06-07 ENCOUNTER — Encounter (HOSPITAL_BASED_OUTPATIENT_CLINIC_OR_DEPARTMENT_OTHER): Payer: Self-pay | Admitting: Anesthesiology

## 2015-06-07 ENCOUNTER — Ambulatory Visit (HOSPITAL_BASED_OUTPATIENT_CLINIC_OR_DEPARTMENT_OTHER): Payer: BLUE CROSS/BLUE SHIELD | Admitting: Anesthesiology

## 2015-06-07 ENCOUNTER — Ambulatory Visit (HOSPITAL_BASED_OUTPATIENT_CLINIC_OR_DEPARTMENT_OTHER)
Admission: RE | Admit: 2015-06-07 | Discharge: 2015-06-07 | Disposition: A | Discharge status: Home / Self Care | Payer: BLUE CROSS/BLUE SHIELD | Source: Ambulatory Visit | Attending: Surgery | Admitting: Surgery

## 2015-06-07 ENCOUNTER — Encounter (HOSPITAL_BASED_OUTPATIENT_CLINIC_OR_DEPARTMENT_OTHER)
Admission: RE | Disposition: A | Discharge status: Home / Self Care | Payer: Self-pay | Source: Ambulatory Visit | Attending: Surgery

## 2015-06-07 DIAGNOSIS — Z7951 Long term (current) use of inhaled steroids: Secondary | ICD-10-CM | POA: Insufficient documentation

## 2015-06-07 DIAGNOSIS — R921 Mammographic calcification found on diagnostic imaging of breast: Secondary | ICD-10-CM | POA: Diagnosis not present

## 2015-06-07 DIAGNOSIS — N6489 Other specified disorders of breast: Secondary | ICD-10-CM | POA: Diagnosis present

## 2015-06-07 DIAGNOSIS — N6091 Unspecified benign mammary dysplasia of right breast: Secondary | ICD-10-CM | POA: Diagnosis not present

## 2015-06-07 DIAGNOSIS — F419 Anxiety disorder, unspecified: Secondary | ICD-10-CM | POA: Diagnosis not present

## 2015-06-07 DIAGNOSIS — I10 Essential (primary) hypertension: Secondary | ICD-10-CM | POA: Diagnosis not present

## 2015-06-07 DIAGNOSIS — K219 Gastro-esophageal reflux disease without esophagitis: Secondary | ICD-10-CM | POA: Diagnosis not present

## 2015-06-07 DIAGNOSIS — M1990 Unspecified osteoarthritis, unspecified site: Secondary | ICD-10-CM | POA: Diagnosis not present

## 2015-06-07 DIAGNOSIS — J45909 Unspecified asthma, uncomplicated: Secondary | ICD-10-CM | POA: Diagnosis not present

## 2015-06-07 DIAGNOSIS — N6310 Unspecified lump in the right breast, unspecified quadrant: Secondary | ICD-10-CM

## 2015-06-07 DIAGNOSIS — Z6838 Body mass index (BMI) 38.0-38.9, adult: Secondary | ICD-10-CM | POA: Insufficient documentation

## 2015-06-07 DIAGNOSIS — Z87891 Personal history of nicotine dependence: Secondary | ICD-10-CM | POA: Insufficient documentation

## 2015-06-07 DIAGNOSIS — Z79899 Other long term (current) drug therapy: Secondary | ICD-10-CM | POA: Insufficient documentation

## 2015-06-07 HISTORY — DX: Depression, unspecified: F32.A

## 2015-06-07 HISTORY — DX: Major depressive disorder, single episode, unspecified: F32.9

## 2015-06-07 HISTORY — PX: BREAST LUMPECTOMY WITH RADIOACTIVE SEED LOCALIZATION: SHX6424

## 2015-06-07 SURGERY — BREAST LUMPECTOMY WITH RADIOACTIVE SEED LOCALIZATION
Anesthesia: General | Site: Breast | Laterality: Right

## 2015-06-07 MED ORDER — OXYCODONE HCL 5 MG PO TABS: 5.0000 mg | ORAL_TABLET | Freq: Once | ORAL | Status: DC | PRN

## 2015-06-07 MED ORDER — BUPIVACAINE-EPINEPHRINE (PF) 0.5% -1:200000 IJ SOLN
INTRAMUSCULAR | Status: AC
  Filled 2015-06-07: qty 30

## 2015-06-07 MED ORDER — HYDROCODONE-ACETAMINOPHEN 5-325 MG PO TABS: 1.0000 | ORAL_TABLET | Freq: Four times a day (QID) | ORAL | Status: DC | PRN

## 2015-06-07 MED ORDER — MIDAZOLAM HCL 2 MG/2ML IJ SOLN
1.0000 mg | INTRAMUSCULAR | Status: DC | PRN
  Administered 2015-06-07: 2 mg via INTRAVENOUS

## 2015-06-07 MED ORDER — LACTATED RINGERS IV SOLN
INTRAVENOUS | Status: DC
  Administered 2015-06-07 (×2): via INTRAVENOUS

## 2015-06-07 MED ORDER — MIDAZOLAM HCL 2 MG/2ML IJ SOLN
INTRAMUSCULAR | Status: AC
  Filled 2015-06-07: qty 2

## 2015-06-07 MED ORDER — CHLORHEXIDINE GLUCONATE 4 % EX LIQD: 1.0000 "application " | Freq: Once | CUTANEOUS | Status: DC

## 2015-06-07 MED ORDER — HYDROMORPHONE HCL 1 MG/ML IJ SOLN
INTRAMUSCULAR | Status: AC
  Filled 2015-06-07: qty 1

## 2015-06-07 MED ORDER — ONDANSETRON HCL 4 MG/2ML IJ SOLN
INTRAMUSCULAR | Status: DC | PRN
  Administered 2015-06-07: 4 mg via INTRAVENOUS

## 2015-06-07 MED ORDER — FENTANYL CITRATE (PF) 100 MCG/2ML IJ SOLN: 50.0000 ug | INTRAMUSCULAR | Status: DC | PRN

## 2015-06-07 MED ORDER — LIDOCAINE HCL (CARDIAC) 20 MG/ML IV SOLN
INTRAVENOUS | Status: DC | PRN
  Administered 2015-06-07: 80 mg via INTRAVENOUS

## 2015-06-07 MED ORDER — FENTANYL CITRATE (PF) 100 MCG/2ML IJ SOLN
INTRAMUSCULAR | Status: AC
  Filled 2015-06-07: qty 4

## 2015-06-07 MED ORDER — PROPOFOL 10 MG/ML IV BOLUS
INTRAVENOUS | Status: DC | PRN
  Administered 2015-06-07 (×2): 20 mg via INTRAVENOUS
  Administered 2015-06-07: 180 mg via INTRAVENOUS

## 2015-06-07 MED ORDER — DEXAMETHASONE SODIUM PHOSPHATE 4 MG/ML IJ SOLN
INTRAMUSCULAR | Status: DC | PRN
  Administered 2015-06-07: 10 mg via INTRAVENOUS

## 2015-06-07 MED ORDER — FENTANYL CITRATE (PF) 100 MCG/2ML IJ SOLN
INTRAMUSCULAR | Status: DC | PRN
  Administered 2015-06-07 (×2): 50 ug via INTRAVENOUS

## 2015-06-07 MED ORDER — OXYCODONE HCL 5 MG/5ML PO SOLN: 5.0000 mg | Freq: Once | ORAL | Status: DC | PRN

## 2015-06-07 MED ORDER — GLYCOPYRROLATE 0.2 MG/ML IJ SOLN: 0.2000 mg | Freq: Once | INTRAMUSCULAR | Status: DC | PRN

## 2015-06-07 MED ORDER — BUPIVACAINE-EPINEPHRINE (PF) 0.25% -1:200000 IJ SOLN
INTRAMUSCULAR | Status: DC | PRN
  Administered 2015-06-07: 19 mL via PERINEURAL

## 2015-06-07 MED ORDER — ONDANSETRON HCL 4 MG/2ML IJ SOLN: 4.0000 mg | Freq: Four times a day (QID) | INTRAMUSCULAR | Status: DC | PRN

## 2015-06-07 MED ORDER — HYDROMORPHONE HCL 1 MG/ML IJ SOLN
0.2500 mg | INTRAMUSCULAR | Status: DC | PRN
  Administered 2015-06-07 (×3): 0.25 mg via INTRAVENOUS

## 2015-06-07 MED ORDER — CEFAZOLIN SODIUM-DEXTROSE 2-3 GM-% IV SOLR
2.0000 g | INTRAVENOUS | Status: AC
  Administered 2015-06-07: 2 g via INTRAVENOUS

## 2015-06-07 SURGICAL SUPPLY — 50 items
APPLIER CLIP 9.375 MED OPEN (MISCELLANEOUS) ×2
APR CLP MED 9.3 20 MLT OPN (MISCELLANEOUS) ×1
BINDER BREAST LRG (GAUZE/BANDAGES/DRESSINGS) IMPLANT
BINDER BREAST MEDIUM (GAUZE/BANDAGES/DRESSINGS) IMPLANT
BINDER BREAST XLRG (GAUZE/BANDAGES/DRESSINGS) IMPLANT
BINDER BREAST XXLRG (GAUZE/BANDAGES/DRESSINGS) ×1 IMPLANT
BLADE SURG 15 STRL LF DISP TIS (BLADE) ×1 IMPLANT
BLADE SURG 15 STRL SS (BLADE) ×2
CANISTER SUC SOCK COL 7IN (MISCELLANEOUS) ×2 IMPLANT
CANISTER SUCT 1200ML W/VALVE (MISCELLANEOUS) IMPLANT
CHLORAPREP W/TINT 26ML (MISCELLANEOUS) ×2 IMPLANT
CLIP APPLIE 9.375 MED OPEN (MISCELLANEOUS) IMPLANT
CLIP TI WIDE RED SMALL 6 (CLIP) ×2 IMPLANT
COVER BACK TABLE 60X90IN (DRAPES) ×2 IMPLANT
COVER MAYO STAND STRL (DRAPES) ×2 IMPLANT
COVER PROBE W GEL 5X96 (DRAPES) ×2 IMPLANT
DECANTER SPIKE VIAL GLASS SM (MISCELLANEOUS) IMPLANT
DEVICE DUBIN W/COMP PLATE 8390 (MISCELLANEOUS) ×2 IMPLANT
DRAPE LAPAROSCOPIC ABDOMINAL (DRAPES) IMPLANT
DRAPE LAPAROTOMY 100X72 PEDS (DRAPES) ×2 IMPLANT
DRAPE UTILITY XL STRL (DRAPES) ×2 IMPLANT
ELECT COATED BLADE 2.86 ST (ELECTRODE) ×2 IMPLANT
ELECT REM PT RETURN 9FT ADLT (ELECTROSURGICAL) ×2
ELECTRODE REM PT RTRN 9FT ADLT (ELECTROSURGICAL) ×1 IMPLANT
GLOVE BIOGEL PI IND STRL 8 (GLOVE) ×1 IMPLANT
GLOVE BIOGEL PI INDICATOR 8 (GLOVE) ×2
GLOVE ECLIPSE 8.0 STRL XLNG CF (GLOVE) ×1 IMPLANT
GLOVE EXAM NITRILE PF MED BLUE (GLOVE) ×1 IMPLANT
GLOVE SURG SS PI 6.5 STRL IVOR (GLOVE) ×1 IMPLANT
GLOVE SURG SS PI 8.0 STRL IVOR (GLOVE) ×1 IMPLANT
GOWN STRL REUS W/ TWL LRG LVL3 (GOWN DISPOSABLE) ×2 IMPLANT
GOWN STRL REUS W/TWL LRG LVL3 (GOWN DISPOSABLE) ×4
HEMOSTAT SNOW SURGICEL 2X4 (HEMOSTASIS) IMPLANT
KIT MARKER MARGIN INK (KITS) ×2 IMPLANT
LIQUID BAND (GAUZE/BANDAGES/DRESSINGS) ×2 IMPLANT
NDL HYPO 25X1 1.5 SAFETY (NEEDLE) ×1 IMPLANT
NEEDLE HYPO 25X1 1.5 SAFETY (NEEDLE) ×2 IMPLANT
NS IRRIG 1000ML POUR BTL (IV SOLUTION) ×2 IMPLANT
PACK BASIN DAY SURGERY FS (CUSTOM PROCEDURE TRAY) ×2 IMPLANT
PENCIL BUTTON HOLSTER BLD 10FT (ELECTRODE) ×2 IMPLANT
SLEEVE SCD COMPRESS KNEE MED (MISCELLANEOUS) ×2 IMPLANT
SPONGE LAP 4X18 X RAY DECT (DISPOSABLE) ×2 IMPLANT
SUT MNCRL AB 4-0 PS2 18 (SUTURE) ×2 IMPLANT
SUT SILK 2 0 SH (SUTURE) IMPLANT
SUT VICRYL 3-0 CR8 SH (SUTURE) ×2 IMPLANT
SYR CONTROL 10ML LL (SYRINGE) ×2 IMPLANT
TOWEL OR 17X24 6PK STRL BLUE (TOWEL DISPOSABLE) ×2 IMPLANT
TOWEL OR NON WOVEN STRL DISP B (DISPOSABLE) ×2 IMPLANT
TUBE CONNECTING 20X1/4 (TUBING) IMPLANT
YANKAUER SUCT BULB TIP NO VENT (SUCTIONS) IMPLANT

## 2015-06-07 NOTE — Anesthesia Postprocedure Evaluation (Signed)
Anesthesia Post Note  Patient: Beth Williams  Procedure(s) Performed: Procedure(s) (LRB): BREAST LUMPECTOMY WITH RADIOACTIVE SEED LOCALIZATION (Right)  Anesthesia type: General  Patient location: PACU  Post pain: Pain level controlled and Adequate analgesia  Post assessment: Post-op Vital signs reviewed, Patient's Cardiovascular Status Stable, Respiratory Function Stable, Patent Airway and Pain level controlled  Last Vitals:  Filed Vitals:   06/07/15 1630  BP: 123/71  Pulse: 66  Temp:   Resp: 15    Post vital signs: Reviewed and stable  Level of consciousness: awake, alert  and oriented  Complications: No apparent anesthesia complications

## 2015-06-07 NOTE — Anesthesia Procedure Notes (Signed)
Procedure Name: LMA Insertion Date/Time: 06/07/2015 2:15 PM Performed by: Lyndee Leo Pre-anesthesia Checklist: Patient identified, Emergency Drugs available, Suction available and Patient being monitored Patient Re-evaluated:Patient Re-evaluated prior to inductionOxygen Delivery Method: Circle System Utilized Preoxygenation: Pre-oxygenation with 100% oxygen Intubation Type: IV induction Ventilation: Mask ventilation without difficulty LMA: LMA inserted LMA Size: 4.0 Number of attempts: 1 Airway Equipment and Method: Bite block Placement Confirmation: positive ETCO2 Tube secured with: Tape Dental Injury: Teeth and Oropharynx as per pre-operative assessment

## 2015-06-07 NOTE — Anesthesia Preprocedure Evaluation (Signed)
Anesthesia Evaluation  Patient identified by MRN, date of birth, ID band Patient awake    Reviewed: Allergy & Precautions, NPO status , Patient's Chart, lab work & pertinent test results  Airway Mallampati: II   Neck ROM: full    Dental   Pulmonary asthma , former smoker,  breath sounds clear to auscultation        Cardiovascular hypertension, Rhythm:regular Rate:Normal     Neuro/Psych Anxiety Depression    GI/Hepatic GERD-  ,  Endo/Other  Morbid obesity  Renal/GU      Musculoskeletal   Abdominal   Peds  Hematology   Anesthesia Other Findings   Reproductive/Obstetrics                             Anesthesia Physical Anesthesia Plan  ASA: II  Anesthesia Plan: General   Post-op Pain Management:    Induction: Intravenous  Airway Management Planned: LMA  Additional Equipment:   Intra-op Plan:   Post-operative Plan:   Informed Consent: I have reviewed the patients History and Physical, chart, labs and discussed the procedure including the risks, benefits and alternatives for the proposed anesthesia with the patient or authorized representative who has indicated his/her understanding and acceptance.     Plan Discussed with: CRNA, Anesthesiologist and Surgeon  Anesthesia Plan Comments:         Anesthesia Quick Evaluation

## 2015-06-07 NOTE — Discharge Instructions (Signed)
[facility] °Office Phone Number [phone] ° °BREAST BIOPSY/ PARTIAL MASTECTOMY: POST OP INSTRUCTIONS ° °Always review your discharge instruction sheet given to you by the facility where your surgery was performed. ° °IF YOU HAVE DISABILITY OR FAMILY LEAVE FORMS, YOU MUST BRING THEM TO THE OFFICE FOR PROCESSING.  DO NOT GIVE THEM TO YOUR DOCTOR. ° °1. A prescription for pain medication may be given to you upon discharge.  Take your pain medication as prescribed, if needed.  If narcotic pain medicine is not needed, then you may take acetaminophen (Tylenol) or ibuprofen (Advil) as needed. °2. Take your usually prescribed medications unless otherwise directed °3. If you need a refill on your pain medication, please contact your pharmacy.  They will contact our office to request authorization.  Prescriptions will not be filled after 5pm or on week-ends. °4. You should eat very light the first 24 hours after surgery, such as soup, crackers, pudding, etc.  Resume your normal diet the day after surgery. °5. Most patients will experience some swelling and bruising in the breast.  Ice packs and a good support bra will help.  Swelling and bruising can take several days to resolve.  °6. It is common to experience some constipation if taking pain medication after surgery.  Increasing fluid intake and taking a stool softener will usually help or prevent this problem from occurring.  A mild laxative (Milk of Magnesia or Miralax) should be taken according to package directions if there are no bowel movements after 48 hours. °7. Unless discharge instructions indicate otherwise, you may remove your bandages 24-48 hours after surgery, and you may shower at that time.  You may have steri-strips (small skin tapes) in place directly over the incision.  These strips should be left on the skin for 7-10 days.  If your surgeon used skin glue on the incision, you may shower in 24 hours.  The glue will flake off over the  next 2-3 weeks.  Any sutures or staples will be removed at the office during your follow-up visit. °8. ACTIVITIES:  You may resume regular daily activities (gradually increasing) beginning the next day.  Wearing a good support bra or sports bra minimizes pain and swelling.  You may have sexual intercourse when it is comfortable. °a. You may drive when you no longer are taking prescription pain medication, you can comfortably wear a seatbelt, and you can safely maneuver your car and apply brakes. °b. RETURN TO WORK:  ______________________________________________________________________________________ °9. You should see your doctor in the office for a follow-up appointment approximately two weeks after your surgery.  Your doctor’s nurse will typically make your follow-up appointment when she calls you with your pathology report.  Expect your pathology report 2-3 business days after your surgery.  You may call to check if you do not hear from us after three days. °10. OTHER INSTRUCTIONS: _______________________________________________________________________________________________ _____________________________________________________________________________________________________________________________________ °_____________________________________________________________________________________________________________________________________ °_____________________________________________________________________________________________________________________________________ ° °WHEN TO CALL YOUR DOCTOR: °1. Fever over 101.0 °2. Nausea and/or vomiting. °3. Extreme swelling or bruising. °4. Continued bleeding from incision. °5. Increased pain, redness, or drainage from the incision. ° °The clinic staff is available to answer your questions during regular business hours.  Please don’t hesitate to call and ask to speak to one of the nurses for clinical concerns.  If you have a medical emergency, go to the nearest  emergency room or call 911.  A surgeon from [facility] is always on call at the hospital. ° °For further questions, please visit [email or web address]  ° ° ° °  Post Anesthesia Home Care Instructions ° °Activity: °Get plenty of rest for the remainder of the day. A responsible adult should stay with you for 24 hours following the procedure.  °For the next 24 hours, DO NOT: °-Drive a car °-Operate machinery °-Drink alcoholic beverages °-Take any medication unless instructed by your physician °-Make any legal decisions or sign important papers. ° °Meals: °Start with liquid foods such as gelatin or soup. Progress to regular foods as tolerated. Avoid greasy, spicy, heavy foods. If nausea and/or vomiting occur, drink only clear liquids until the nausea and/or vomiting subsides. Call your physician if vomiting continues. ° °Special Instructions/Symptoms: °Your throat may feel dry or sore from the anesthesia or the breathing tube placed in your throat during surgery. If this causes discomfort, gargle with warm salt water. The discomfort should disappear within 24 hours. ° °If you had a scopolamine patch placed behind your ear for the management of post- operative nausea and/or vomiting: ° °1. The medication in the patch is effective for 72 hours, after which it should be removed.  Wrap patch in a tissue and discard in the trash. Wash hands thoroughly with soap and water. °2. You may remove the patch earlier than 72 hours if you experience unpleasant side effects which may include dry mouth, dizziness or visual disturbances. °3. Avoid touching the patch. Wash your hands with soap and water after contact with the patch. °  ° °

## 2015-06-07 NOTE — H&P (Signed)
H&P   LORILYN LAITINEN (MR# [id number])      H&P Info    Author Note Status Last Update User Last Update Date/Time   Erroll Luna, MD Signed Erroll Luna, MD 05/06/2015 12:42 PM    H&P    Expand All Collapse All   Zayah Keilman 05/06/2015 11:03 AM Location: [facility] Patient #: [id number] DOB: 07-28-1956 Married / Language: Cleophus Molt / Race: White Female History of Present Illness [name] A. Ascencion Coye MD; 05/06/2015 12:41 PM) Patient words: New Patient  PT SENT AT [facility] RIGHT BREAST MAMMOGRAPHIC ABNORMALITY. tHIS WAS PICKED UP ON MAMMOGRAM AND BX SHOWED SCLEROSING LESION. nO MASS, PAIN OR NIPPLE DISCHARGE.  CLINICAL DATA: Screening recall for possible right breast distortion.  EXAM: DIGITAL DIAGNOSTIC RIGHT MAMMOGRAM WITH 3D TOMOSYNTHESIS AND CAD  RIGHT BREAST ULTRASOUND  COMPARISON: Previous exams.  ACR Breast Density Category c: The breast tissue is heterogeneously dense, which may obscure small masses.  FINDINGS: CC tomosynthesis, true lateral tomosynthesis, and spot compression MLO tomosynthesis was performed of the right breast. In addition, spot compression magnification views were performed over the inner slightly upper right breast. The initially questioned possible distortion in the slightly inner slightly upper right breast appears to persist on the additional images. There are faint microcalcifications associated with the distortion spanning a distance of approximately 1.2 cm.  Mammographic images were processed with CAD.  Physical examination of the inner right breast does not reveal any palpable masses.  Targeted ultrasound of the right breast was performed. No discrete masses or abnormalities are seen, only normal appearing heterogeneous fibroglandular tissue is visualized. The entire central and inner right breast was scanned.  IMPRESSION: Suspicious right breast distortion seen in the slightly inner slightly upper  right breast on mammography only, with associated microcalcifications.  RECOMMENDATION: Stereotactic guided biopsy of the calcifications in the slightly inner slightly upper right breast in the region of distortion is recommended. This is scheduled for 04/29/2015 8 a.m.  I have discussed the findings and recommendations with the patient. Results were also provided in writing at the conclusion of the visit. If applicable, a reminder letter will be sent to the patient regarding the next appointment.  BI-RADS CATEGORY 4: Suspicious.   Electronically Signed By: Everlean Alstrom M.D. On: 04/22/2015 10:30        Breast, right, needle core biopsy, medial - SCLEROSING LESION WITH FIBROCYSTIC CHANGES, ADENOSIS AND ASSOCIATED CALCIFICATIONS. - NO ATYPIA OR MALIGNANCY IDENTIFIED. - SEE COMMENT. CLINICAL DATA: Status post stereotactic core biopsy right breast calcifications with architectural distortion.  EXAM: DIAGNOSTIC RIGHT MAMMOGRAM POST STEREOTACTIC BIOPSY  COMPARISON: Previous exam(s).  FINDINGS: Mammographic images were obtained following right breast stereotactic guided biopsy of calcification with architectural distal. Tomographic CC and lateral views of the right breast demonstrate x shaped biopsy clip in the area of concern.  IMPRESSION: Post biopsy mammogram demonstrating biopsy clip in the area of concern.  Final Assessment: Post Procedure Mammograms for Marker Placement.  The patient is a 59 year old female   Other Problems Sharman Crate, LPN; 0/02/7740 28:78 AM) Anxiety Disorder Arthritis Asthma Back Pain Gastroesophageal Reflux Disease Heart murmur High blood pressure Oophorectomy  Past Surgical History Sharman Crate, LPN; 06/07/6719 94:70 AM) Breast Biopsy Left. Cesarean Section - Multiple Hysterectomy (not due to cancer) - Complete Knee Surgery Right. Oral Surgery Tonsillectomy  Diagnostic Studies History Sharman Crate,  LPN; 09/06/2835 62:94 AM) Colonoscopy 5-10 years ago Mammogram within last year Pap Smear 1-5 years ago  Allergies Clarise Cruz  Sakowski, LPN; 05/06/9037 33:38 AM) Latex Shellfish Contrast Media Ready-Box *MEDICAL DEVICES AND SUPPLIES* Aspirin *ANALGESICS - NonNarcotic* Tamiflu *ANTIVIRALS* Adhesive Tape  Medication History Sharman Crate, LPN; 03/01/9190 66:06 AM) Ruthe Mannan (200-5MCG/ACT Aerosol, Inhalation) Active. Oxybutynin Chloride ER (5MG  Tablet ER 24HR, Oral as needed) Active. Restasis (0.05% Emulsion, Ophthalmic daily) Active. Singulair (10MG  Tablet, Oral daily) Active. Omeprazole (20MG  Capsule DR, Oral daily) Active. Allopurinol (100MG  Tablet, Oral daily) Active. Hydrochlorothiazide (12.5MG  Capsule, Oral daily) Active.  Social History Sharman Crate, LPN; 0/0/4599 77:41 AM) Caffeine use Coffee, Tea. No alcohol use No drug use Tobacco use Former smoker.  Family History Sharman Crate, LPN; 04/01/3952 20:23 AM) Cancer Mother. Diabetes Mellitus Family Members In General, Mother. Hypertension Mother. Respiratory Condition Mother. Thyroid problems Mother.  Pregnancy / Birth History Sharman Crate, LPN; 03/04/3567 61:68 AM) Age of menopause 51-55 Contraceptive History Oral contraceptives. Gravida 2 Maternal age 71-25 Para 2     Review of Systems Clarise Cruz [facility] LPN; 03/06/2901 11:15 AM) General Not Present- Appetite Loss, Chills, Fatigue, Fever, Night Sweats, Weight Gain and Weight Loss. Skin Present- Dryness. Not Present- Change in Wart/Mole, Hives, Jaundice, New Lesions, Non-Healing Wounds, Rash and Ulcer. HEENT Present- Seasonal Allergies and Wears glasses/contact lenses. Not Present- Earache, Hearing Loss, Hoarseness, Nose Bleed, Oral Ulcers, Ringing in the Ears, Sinus Pain, Sore Throat, Visual Disturbances and Yellow Eyes. Cardiovascular Present- Palpitations, Rapid Heart Rate and Swelling of Extremities. Not Present- Chest Pain, Difficulty Breathing  Lying Down, Leg Cramps and Shortness of Breath. Gastrointestinal Not Present- Abdominal Pain, Bloating, Bloody Stool, Change in Bowel Habits, Chronic diarrhea, Constipation, Difficulty Swallowing, Excessive gas, Gets full quickly at meals, Hemorrhoids, Indigestion, Nausea, Rectal Pain and Vomiting. Female Genitourinary Not Present- Frequency, Nocturia, Painful Urination, Pelvic Pain and Urgency. Musculoskeletal Not Present- Back Pain, Joint Pain, Joint Stiffness, Muscle Pain, Muscle Weakness and Swelling of Extremities. Neurological Not Present- Decreased Memory, Fainting, Headaches, Numbness, Seizures, Tingling, Tremor, Trouble walking and Weakness. Psychiatric Not Present- Anxiety, Bipolar, Change in Sleep Pattern, Depression, Fearful and Frequent crying. Endocrine Not Present- Cold Intolerance, Excessive Hunger, Hair Changes, Heat Intolerance, Hot flashes and New Diabetes. Hematology Not Present- Easy Bruising, Excessive bleeding, Gland problems, HIV and Persistent Infections.  Vitals Clarise Cruz [facility] LPN; 05/01/801 23:36 AM) 05/06/2015 11:04 AM Weight: 205.5 lb Height: 61in Body Surface Area: 2 m Body Mass Index: 38.83 kg/m Temp.: 98.8F(Oral)  Pulse: 69 (Regular)  BP: 142/78 (Sitting, Left Arm, Standard)     Physical Exam (Cerinity Zynda A. Doyce Saling MD; 05/06/2015 12:41 PM)  General Mental Status-Alert. General Appearance-Consistent with stated age. Hydration-Well hydrated. Voice-Normal.  Head and Neck Head-normocephalic, atraumatic with no lesions or palpable masses. Trachea-midline. Thyroid Gland Characteristics - normal size and consistency.  Eye Eyeball - Bilateral-Extraocular movements intact. Sclera/Conjunctiva - Bilateral-No scleral icterus.  Chest and Lung Exam Chest and lung exam reveals -quiet, even and easy respiratory effort with no use of accessory muscles and on auscultation, normal breath sounds, no adventitious sounds and normal vocal  resonance. Inspection Chest Wall - Normal. Back - normal.  Breast Breast - Left-Symmetric, Non Tender, No Biopsy scars, no Dimpling, No Inflammation, No Lumpectomy scars, No Mastectomy scars, No Peau d' Orange. Breast - Right-Symmetric, Non Tender, No Biopsy scars, no Dimpling, No Inflammation, No Lumpectomy scars, No Mastectomy scars, No Peau d' Orange. Breast Lump-No Palpable Breast Mass. Note: [place] RIGHT BREAST   Cardiovascular Cardiovascular examination reveals -normal heart sounds, regular rate and rhythm with no murmurs and normal pedal pulses bilaterally.  Abdomen Inspection Inspection of the abdomen reveals - No Hernias.  Skin - Scar - no surgical scars. Palpation/Percussion Palpation and Percussion of the abdomen reveal - Soft, Non Tender, No Rebound tenderness, No Rigidity (guarding) and No hepatosplenomegaly. Auscultation Auscultation of the abdomen reveals - Bowel sounds normal.  Neurologic Neurologic evaluation reveals -alert and oriented x 3 with no impairment of recent or remote memory. Mental Status-Normal.  Musculoskeletal Normal Exam - Left-Upper Extremity Strength Normal and Lower Extremity Strength Normal. Normal Exam - Right-Upper Extremity Strength Normal and Lower Extremity Strength Normal.  Lymphatic Head & Neck  General Head & Neck Lymphatics: Bilateral - Description - Normal. Axillary  General Axillary Region: Bilateral - Description - Normal. Tenderness - Non Tender. Femoral & Inguinal  Generalized Femoral & Inguinal Lymphatics: Bilateral - Description - Normal. Tenderness - Non Tender.    Assessment & Plan (Marisah Laker A. Garek Schuneman MD; 05/06/2015 12:41 PM)  BREAST MASS, RIGHT (611.72  N63) Impression: recommend excision right breast mass for sclerosing lesion small but finite risk of malignancy Risk of lumpectomy include bleeding, infection, seroma, more surgery, use of seed/wire, wound care, cosmetic deformity and the need for  other treatments, death , blood clots, death. Pt agrees to proceed.  Current Plans Pt Education - [facility] Breast Biopsy [facility]

## 2015-06-07 NOTE — Transfer of Care (Signed)
Immediate Anesthesia Transfer of Care Note  Patient: Beth Williams  Procedure(s) Performed: Procedure(s): BREAST LUMPECTOMY WITH RADIOACTIVE SEED LOCALIZATION (Right)  Patient Location: PACU  Anesthesia Type:General  Level of Consciousness: awake, alert  and patient cooperative  Airway & Oxygen Therapy: Patient Spontanous Breathing and Patient connected to face mask oxygen  Post-op Assessment: Report given to RN and Post -op Vital signs reviewed and stable  Post vital signs: Reviewed and stable  Last Vitals:  Filed Vitals:   06/07/15 1306  BP: 149/86  Pulse: 70  Temp: 36.8 C  Resp: 18    Complications: No apparent anesthesia complications

## 2015-06-07 NOTE — Op Note (Signed)
Peoperative diagnosis: RIGHT BREAST  SCLEROSING LESION   Postoperative diagnosis: Same   Procedure: RIGHT  breast seed localized lumpectomy  Surgeon: Erroll Luna M.D.  Anesthesia: Gen. With 0.25% Sensorcaine local  EBL: 20 cc  Specimen: RIGHT breast tissue with clip and radioactive seed in the specimen. Verified with neoprobe and radiographic image showing both seed and clip in specimen  Indications for procedure: The patient presents for RIGHT  breast  lumpectomy after core biopsy showed SCLEROSING LESION. Discussed the rationale for considering excision. Small risk of malignancy associated with THIS  lesion after core biopsy. Discussed observation. Discussed wire localization. Patient desired excision of RIGHT breast SCLEROSING .The procedure has been discussed with the patient. Alternatives to surgery have been discussed with the patient.  Risks of surgery include bleeding,  Infection,  Seroma formation, death,  and the need for further surgery.   The patient understands and wishes to proceed.   Description of procedure: Patient underwent seed placement as an outpatient. Patient presents today for RIGHT  breast seed localized lumpectomy. Patient and holding area. Questions are answered and neoprobe used to verify seed location. Patient taken back to the operating room and placed upon the OR table. After induction of general anesthesia, RIGHT  breast prepped and draped in a sterile fashion. Timeout was done to verify proper side and procedure. Neoprobe used and hot spot identified and left breast upper-outer quadrant. This was marked with pen. Curvilinear incision made right  upper outer quadrant breast. Dissection used with the help of a neoprobe around the tissue where the seed and clip were located. Tissue removed in its entirety with gross negative  margins.. Neoprobe used and seed and clip  within specimen. Radiographs taken which show clip and seed  In specimen.Hemostasis achieved and  cavity closed with 3-0 Vicryl and 4-0 Monocryl. Dermabond applied. All final counts found to be correct. Specimen transported to pathology. Patient awoke extubated taken to recovery in satisfactory condition.

## 2015-06-07 NOTE — Interval H&P Note (Signed)
History and Physical Interval Note:  06/07/2015 1:39 PM  Beth Williams  has presented today for surgery, with the diagnosis of Right Breast Lesion  The various methods of treatment have been discussed with the patient and family. After consideration of risks, benefits and other options for treatment, the patient has consented to  Procedure(s): BREAST LUMPECTOMY WITH RADIOACTIVE SEED LOCALIZATION (Right) as a surgical intervention .  The patient's history has been reviewed, patient examined, no change in status, stable for surgery.  I have reviewed the patient's chart and labs.  Questions were answered to the patient's satisfaction.     Renisha Cockrum A.

## 2015-06-09 ENCOUNTER — Encounter (HOSPITAL_BASED_OUTPATIENT_CLINIC_OR_DEPARTMENT_OTHER): Payer: Self-pay | Admitting: Surgery

## 2015-06-28 ENCOUNTER — Telehealth: Payer: Self-pay | Admitting: *Deleted

## 2015-06-28 ENCOUNTER — Other Ambulatory Visit: Payer: Self-pay | Admitting: Surgery

## 2015-06-28 DIAGNOSIS — N6099 Unspecified benign mammary dysplasia of unspecified breast: Secondary | ICD-10-CM

## 2015-06-28 NOTE — Telephone Encounter (Signed)
Received referral from [place].  Called pt and confirmed 07/05/15 high risk appt.  Mailed calendar, welcoming packet & intake form to pt.  Emailed Engineer, civil (consulting) at [employer] to make her aware.

## 2015-07-05 ENCOUNTER — Encounter: Payer: Self-pay | Admitting: Hematology and Oncology

## 2015-07-05 ENCOUNTER — Ambulatory Visit: Payer: BLUE CROSS/BLUE SHIELD

## 2015-07-05 ENCOUNTER — Ambulatory Visit (HOSPITAL_BASED_OUTPATIENT_CLINIC_OR_DEPARTMENT_OTHER): Payer: BLUE CROSS/BLUE SHIELD | Admitting: Hematology and Oncology

## 2015-07-05 VITALS — BP 155/97 | HR 86 | Temp 97.9°F | Resp 18 | Ht 61.0 in | Wt 206.7 lb

## 2015-07-05 DIAGNOSIS — N62 Hypertrophy of breast: Secondary | ICD-10-CM | POA: Diagnosis not present

## 2015-07-05 DIAGNOSIS — N6091 Unspecified benign mammary dysplasia of right breast: Secondary | ICD-10-CM | POA: Insufficient documentation

## 2015-07-05 NOTE — Progress Notes (Signed)
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 She had long-standing issues with her breasts where she is to get follow-ups every 3-6 months or the past couple of years. This time she underwent a 3-D mammogram with Dr. Tressia Danas and she was referred to the breast center. She underwent an ultrasound and a biopsy which came back as complex sclerosing lesion. She underwent lumpectomy on 06/07/2015 which revealed complex sclerosing lesion with atypical ductal hyperplasia. She has recovered very well from surgery and appears to be doing quite well.  I reviewed her records extensively and collaborated the history with the patient.   MEDICAL HISTORY:  Past Medical History  Diagnosis Date  . Asthma   . GERD (gastroesophageal reflux disease)   . Hypertension   . Heart murmur     heart murmur, goes to [facility] in [facility], Dr Maia Breslow  . Cancer     right breat  . Claustrophobia   . Depression     SURGICAL HISTORY: Past Surgical History  Procedure Laterality Date  . Stapedectomy Bilateral 2011  . Abdominal hysterectomy  2008  . Knee arthroscopy  2011    right  . Cesarean section    . Tubal ligation    . Ablation      uterine  . Breast lumpectomy with radioactive seed localization Right 06/07/2015    Procedure: BREAST LUMPECTOMY WITH RADIOACTIVE SEED LOCALIZATION;  Surgeon: Erroll Luna, MD;  Location: [facility];  Service: General;  Laterality: Right;    SOCIAL HISTORY: History   Social History  . Marital Status: Married    Spouse Name: N/A  . Number of Children: N/A  . Years of Education: N/A    Occupational History  . Not on file.   Social History Main Topics  . Smoking status: Former Research scientist (life sciences)  . Smokeless tobacco: Not on file  . Alcohol Use: No  . Drug Use: No  . Sexual Activity: Not on file   Other Topics Concern  . Not on file   Social History Narrative    FAMILY HISTORY: Family History  Problem Relation Age of Onset  . COPD Mother   . Cancer Mother     breast    ALLERGIES:  is allergic to latex; adhesive; aspirin; iodine; ivp dye; shellfish allergy; and tamiflu.  MEDICATIONS:  Current Outpatient Prescriptions  Medication Sig Dispense Refill  . albuterol (PROVENTIL HFA;VENTOLIN HFA) 108 (90 BASE) MCG/ACT inhaler Inhale 2 puffs into the lungs every 6 (six) hours as needed.    Marland Kitchen allopurinol (ZYLOPRIM) 100 MG tablet Take 100 mg by mouth daily.    . carboxymethylcellulose (REFRESH PLUS) 0.5 % SOLN 1 drop daily as needed.    . cycloSPORINE (RESTASIS) 0.05 % ophthalmic emulsion Place 1 drop into both eyes 2 (two) times daily.    . hydrochlorothiazide (MICROZIDE) 12.5 MG capsule Take 12.5 mg by mouth daily.    Marland Kitchen HYDROcodone-acetaminophen (NORCO) 5-325 MG per tablet Take 1 tablet by mouth every 6 (six) hours as needed for moderate pain. 30 tablet 0  . ipratropium-albuterol (DUONEB) 0.5-2.5 (3) MG/3ML SOLN Take 3 mLs by nebulization.    . mometasone-formoterol (DULERA) 100-5 MCG/ACT AERO Inhale 2 puffs into the  lungs 2 (two) times daily.    . montelukast (SINGULAIR) 10 MG tablet Take 10 mg by mouth at bedtime.    Marland Kitchen omeprazole (PRILOSEC) 20 MG capsule Take 20 mg by mouth daily.    Marland Kitchen oxybutynin (DITROPAN-XL) 10 MG 24 hr tablet Take 10 mg by mouth at bedtime.     No current facility-administered medications for this visit.    REVIEW OF SYSTEMS:   Constitutional: Denies fevers, chills or abnormal night sweats Eyes: Denies blurriness of vision, double vision or watery eyes Ears, nose, mouth, throat, and face: Denies mucositis or sore throat Respiratory: Denies cough,  dyspnea or wheezes Cardiovascular: Denies palpitation, chest discomfort or lower extremity swelling Gastrointestinal:  Denies nausea, heartburn or change in bowel habits Skin: Denies abnormal skin rashes Lymphatics: Denies new lymphadenopathy or easy bruising Neurological:Denies numbness, tingling or new weaknesses Behavioral/Psych: Mood is stable, no new changes  Breast:  Denies any palpable lumps or discharge All other systems were reviewed with the patient and are negative.  PHYSICAL EXAMINATION: ECOG PERFORMANCE STATUS: 0 - Asymptomatic  Filed Vitals:   07/05/15 1305  BP: 155/97  Pulse: 86  Temp: 97.9 F (36.6 C)  Resp: 18   Filed Weights   07/05/15 1305  Weight: 206 lb 11.2 oz (93.759 kg)    GENERAL:alert, no distress and comfortable SKIN: skin color, texture, turgor are normal, no rashes or significant lesions EYES: normal, conjunctiva are pink and non-injected, sclera clear OROPHARYNX:no exudate, no erythema and lips, buccal mucosa, and tongue normal  NECK: supple, thyroid normal size, non-tender, without nodularity LYMPH:  no palpable lymphadenopathy in the cervical, axillary or inguinal LUNGS: clear to auscultation and percussion with normal breathing effort HEART: regular rate & rhythm and no murmurs and no lower extremity edema ABDOMEN:abdomen soft, non-tender and normal bowel sounds Musculoskeletal:no cyanosis of digits and no clubbing  PSYCH: alert & oriented x 3 with fluent speech NEURO: no focal motor/sensory deficits  LABORATORY DATA:  I have reviewed the data as listed Lab Results  Component Value Date   WBC 5.9 12/10/2007   HGB 11.0* 12/10/2007   HCT 32.0* 12/10/2007   MCV 83.9 12/10/2007   PLT 243 12/10/2007   Lab Results  Component Value Date   NA 141 12/09/2007   K 3.4* 12/09/2007   CL 107 12/09/2007   CO2 27 12/09/2007    ASSESSMENT AND PLAN:  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  Risk reduction techniques: 1. I discussed with her that there is no standard breast reduction measure. However tamoxifen or raloxifene could be options if patient chose to take a medication for breast cancer risk reduction.  I believe her risk of breast cancer is fairly low and hence did not overwhelmingly support the idea of taking antiestrogen therapy. If she does wish to lower her risk, tamoxifen or raloxifene could be options.  Surveillance plan: 1. Annual screening mammograms with Dr. Runell Gess 2. Periodic breast exams which she can do with either Dr. Brantley Stage to Dr. Helane Rima   Return to clinic if she decides to go on tamoxifen on raloxifene.  If she decides to watch and wait, certainly the follow-ups could be done through Dr. Runell Gess.    thank you very much for consulting [place] in the high risk breast clinic.  All questions were answered. The patient knows to call the clinic with any problems, questions or concerns.    Rulon Eisenmenger, MD 1:44 PM

## 2015-07-05 NOTE — Assessment & Plan Note (Signed)
Complex sclerosing lesion right breast with atypical ductal hyperplasia and fibrocystic changes status post lumpectomy 06/07/2015  Atypical ductal hyperplasia: I discussed with the patient that in this situation the epithelial cells inside the duct have proliferated but only partially filling the involved duct, in addition to proliferation of ducts Complex sclerosing lesion is an area of hardened breast tissue which is secondary to scarring  These findings are benign in nature.  Risk reduction techniques: 1. I discussed with her that there is no standard breast reduction measure. However tamoxifen or raloxifene could be options if patient chose to take a medication for breast cancer risk reduction.  I believe her risk of breast cancer is fairly low and hence did not overwhelmingly support the idea of taking antiestrogen therapy. If she does wish to lower her risk, tamoxifen or raloxifene could be options.  Surveillance plan: 1. Annual screening mammograms with Dr. Runell Gess 2. Periodic breast exams which she can do with either Dr. Brantley Stage to Dr. Helane Rima   Return to clinic if she decides to go on tamoxifen on raloxifene. If she decides to watch and wait, certainly the follow-ups could be done through Dr. Runell Gess.

## 2015-07-05 NOTE — Progress Notes (Signed)
Checked in new pt with no financial concerns.  She has my card for any questions or concerns.

## 2015-10-21 ENCOUNTER — Other Ambulatory Visit: Payer: Self-pay | Admitting: Obstetrics and Gynecology

## 2015-10-21 DIAGNOSIS — N6001 Solitary cyst of right breast: Secondary | ICD-10-CM

## 2015-10-21 DIAGNOSIS — C50911 Malignant neoplasm of unspecified site of right female breast: Secondary | ICD-10-CM

## 2015-10-25 ENCOUNTER — Ambulatory Visit
Admission: RE | Admit: 2015-10-25 | Discharge: 2015-10-25 | Disposition: A | Discharge status: Home / Self Care | Payer: BLUE CROSS/BLUE SHIELD | Source: Ambulatory Visit | Attending: Obstetrics and Gynecology | Admitting: Obstetrics and Gynecology

## 2015-10-25 DIAGNOSIS — C50911 Malignant neoplasm of unspecified site of right female breast: Secondary | ICD-10-CM

## 2015-10-25 DIAGNOSIS — N6001 Solitary cyst of right breast: Secondary | ICD-10-CM

## 2016-08-09 ENCOUNTER — Emergency Department (HOSPITAL_COMMUNITY): Payer: BLUE CROSS/BLUE SHIELD

## 2016-08-09 ENCOUNTER — Emergency Department (HOSPITAL_COMMUNITY)
Admission: EM | Admit: 2016-08-09 | Discharge: 2016-08-09 | Disposition: A | Discharge status: Home / Self Care | Payer: BLUE CROSS/BLUE SHIELD | Attending: Emergency Medicine | Admitting: Emergency Medicine

## 2016-08-09 ENCOUNTER — Encounter (HOSPITAL_COMMUNITY): Payer: Self-pay | Admitting: Emergency Medicine

## 2016-08-09 DIAGNOSIS — M25532 Pain in left wrist: Secondary | ICD-10-CM

## 2016-08-09 DIAGNOSIS — Y929 Unspecified place or not applicable: Secondary | ICD-10-CM | POA: Insufficient documentation

## 2016-08-09 DIAGNOSIS — W010XXA Fall on same level from slipping, tripping and stumbling without subsequent striking against object, initial encounter: Secondary | ICD-10-CM | POA: Diagnosis not present

## 2016-08-09 DIAGNOSIS — I10 Essential (primary) hypertension: Secondary | ICD-10-CM | POA: Diagnosis not present

## 2016-08-09 DIAGNOSIS — S0083XA Contusion of other part of head, initial encounter: Secondary | ICD-10-CM

## 2016-08-09 DIAGNOSIS — Y939 Activity, unspecified: Secondary | ICD-10-CM | POA: Diagnosis not present

## 2016-08-09 DIAGNOSIS — Z7951 Long term (current) use of inhaled steroids: Secondary | ICD-10-CM | POA: Diagnosis not present

## 2016-08-09 DIAGNOSIS — S62102A Fracture of unspecified carpal bone, left wrist, initial encounter for closed fracture: Secondary | ICD-10-CM

## 2016-08-09 DIAGNOSIS — W19XXXA Unspecified fall, initial encounter: Secondary | ICD-10-CM

## 2016-08-09 DIAGNOSIS — K0889 Other specified disorders of teeth and supporting structures: Secondary | ICD-10-CM

## 2016-08-09 DIAGNOSIS — S01511A Laceration without foreign body of lip, initial encounter: Secondary | ICD-10-CM

## 2016-08-09 DIAGNOSIS — Z853 Personal history of malignant neoplasm of breast: Secondary | ICD-10-CM | POA: Diagnosis not present

## 2016-08-09 DIAGNOSIS — S032XXA Dislocation of tooth, initial encounter: Secondary | ICD-10-CM | POA: Diagnosis not present

## 2016-08-09 DIAGNOSIS — S52602A Unspecified fracture of lower end of left ulna, initial encounter for closed fracture: Secondary | ICD-10-CM | POA: Insufficient documentation

## 2016-08-09 DIAGNOSIS — I6782 Cerebral ischemia: Secondary | ICD-10-CM | POA: Insufficient documentation

## 2016-08-09 DIAGNOSIS — Y999 Unspecified external cause status: Secondary | ICD-10-CM | POA: Insufficient documentation

## 2016-08-09 DIAGNOSIS — Z79899 Other long term (current) drug therapy: Secondary | ICD-10-CM | POA: Insufficient documentation

## 2016-08-09 DIAGNOSIS — J45909 Unspecified asthma, uncomplicated: Secondary | ICD-10-CM | POA: Insufficient documentation

## 2016-08-09 DIAGNOSIS — S52502A Unspecified fracture of the lower end of left radius, initial encounter for closed fracture: Secondary | ICD-10-CM

## 2016-08-09 DIAGNOSIS — S52601A Unspecified fracture of lower end of right ulna, initial encounter for closed fracture: Secondary | ICD-10-CM

## 2016-08-09 DIAGNOSIS — S6992XA Unspecified injury of left wrist, hand and finger(s), initial encounter: Secondary | ICD-10-CM | POA: Diagnosis present

## 2016-08-09 HISTORY — DX: Anxiety disorder, unspecified: F41.9

## 2016-08-09 MED ORDER — LIDOCAINE HCL (PF) 1 % IJ SOLN
20.0000 mL | Freq: Once | INTRAMUSCULAR | Status: DC
  Filled 2016-08-09: qty 30

## 2016-08-09 MED ORDER — ONDANSETRON HCL 4 MG/2ML IJ SOLN
4.0000 mg | Freq: Once | INTRAMUSCULAR | Status: AC
  Administered 2016-08-09: 4 mg via INTRAVENOUS
  Filled 2016-08-09: qty 2

## 2016-08-09 MED ORDER — OXYCODONE-ACETAMINOPHEN 5-325 MG PO TABS: 2.0000 | ORAL_TABLET | Freq: Three times a day (TID) | ORAL | 0 refills | Status: DC | PRN

## 2016-08-09 MED ORDER — FENTANYL CITRATE (PF) 100 MCG/2ML IJ SOLN
50.0000 ug | Freq: Once | INTRAMUSCULAR | Status: AC
  Administered 2016-08-09: 50 ug via INTRAVENOUS
  Filled 2016-08-09: qty 2

## 2016-08-09 MED ORDER — HYDROMORPHONE HCL 1 MG/ML IJ SOLN
1.0000 mg | Freq: Once | INTRAMUSCULAR | Status: AC
  Administered 2016-08-09: 1 mg via INTRAVENOUS
  Filled 2016-08-09: qty 1

## 2016-08-09 MED ORDER — ACETAMINOPHEN 500 MG PO TABS: 1000.0000 mg | ORAL_TABLET | Freq: Three times a day (TID) | ORAL | 0 refills | Status: AC

## 2016-08-09 MED ORDER — PROPOFOL 10 MG/ML IV BOLUS
200.0000 mg | Freq: Once | INTRAVENOUS | Status: AC
  Administered 2016-08-09: 20 mg via INTRAVENOUS
  Filled 2016-08-09: qty 20

## 2016-08-09 MED ORDER — PROPOFOL 10 MG/ML IV BOLUS
INTRAVENOUS | Status: AC | PRN
  Administered 2016-08-09 (×3): 10 mg via INTRAVENOUS

## 2016-08-09 MED ORDER — HYDROMORPHONE HCL 1 MG/ML IJ SOLN
0.5000 mg | Freq: Once | INTRAMUSCULAR | Status: AC
  Administered 2016-08-09: 0.5 mg via INTRAVENOUS
  Filled 2016-08-09: qty 1

## 2016-08-09 MED ORDER — LIDOCAINE HCL 2 % IJ SOLN
10.0000 mL | Freq: Once | INTRAMUSCULAR | Status: AC
  Administered 2016-08-09: 200 mg
  Filled 2016-08-09: qty 20

## 2016-08-09 NOTE — ED Notes (Signed)
MD at bedside. 

## 2016-08-09 NOTE — Sedation Documentation (Signed)
Pt starting to get sleepy. More sedation medication given.

## 2016-08-09 NOTE — ED Notes (Signed)
Pt transported to xray 

## 2016-08-09 NOTE — Sedation Documentation (Signed)
Splinting completed. Pt alert and oriented though remains drowsy

## 2016-08-09 NOTE — ED Notes (Signed)
Pt does not want more pain medication at this time.

## 2016-08-09 NOTE — ED Notes (Signed)
PA at bedside to suture lip

## 2016-08-09 NOTE — Sedation Documentation (Signed)
Arm being splinted by MD and ortho tech. Pt responsive to pain only.

## 2016-08-09 NOTE — Sedation Documentation (Signed)
Pt responsive to pain only. Manipulation of L arm by MD and ortho tech.

## 2016-08-09 NOTE — ED Provider Notes (Signed)
LACERATION REPAIR Performed by: Elmer Ramp Authorized by: Elmer Ramp Consent: Verbal consent obtained. Risks and benefits: risks, benefits and alternatives were discussed Consent given by: patient Patient identity confirmed: provided demographic data Prepped and Draped in normal sterile fashion Wound explored  Laceration Location: lower lip  Laceration Length: 1.5 cm  No Foreign Bodies seen or palpated  Anesthesia: local infiltration   Local anesthetic: lidocaine 2% 0 epinephrine  Anesthetic total: 1 ml  Irrigation method: syringe Amount of cleaning: standard  Skin closure: simple  Number of sutures: 3  Technique: simple interrupted   Patient tolerance: Patient tolerated the procedure well with no immediate complications.   Okey Regal, PA-C 08/09/16 1511

## 2016-08-09 NOTE — Sedation Documentation (Signed)
First sedation med given. Pt alert and oriented. MD, ortho tech and RN at bedside.

## 2016-08-09 NOTE — ED Triage Notes (Signed)
Pt tripped over shoes this am and fell face forward onto tile floor. No LOC. Per EMS, pt has L wrist deformity, splinted prior to arrival. Cap refill under 3 seconds. Pt given 151mcg fentanyl prior to arrival. Pt also has L lip laceration and bruise lateral to L eye. Also reports teeth and neck pain. Pt thinks teeth are lining up normally.

## 2016-08-09 NOTE — Sedation Documentation (Signed)
Pt still awake and oriented. More sedation given

## 2016-08-09 NOTE — ED Provider Notes (Signed)
[facility] DEPT Provider Note   CSN: [id number] Arrival date & time: 08/09/16  [id number]  First Provider Contact:  First MD Initiated Contact with Patient 08/09/16 [id number]        History   Chief Complaint Chief Complaint  Patient presents with  . Fall  . Wrist Injury    HPI Beth Williams is a 60 y.o. female.  The history is provided by the patient.  Fall  This is a new problem. The current episode started less than 1 hour ago. The problem occurs rarely. Pertinent negatives include no chest pain, no abdominal pain, no headaches and no shortness of breath. Nothing aggravates the symptoms. Nothing relieves the symptoms. She has tried nothing for the symptoms.  Wrist Injury   The incident occurred less than 1 hour ago. Incident location: store. The injury mechanism was a fall. The pain is present in the left wrist. The quality of the pain is described as aching. The pain is severe. The pain has been constant since the incident. Pertinent negatives include no fever. She has tried nothing for the symptoms.   Also has neck, teeth pain, and lip laceration.    Past Medical History:  Diagnosis Date  . Anxiety   . Asthma   . Cancer ([place])    right breat  . Claustrophobia   . Depression   . GERD (gastroesophageal reflux disease)   . Heart murmur    heart murmur, goes to [facility] in [facility], Dr Maia Breslow  . Hypertension     Patient Active Problem List   Diagnosis Date Noted  . Atypical ductal hyperplasia of right breast 07/05/2015  . Skin lesion of breast 07/21/2011    Past Surgical History:  Procedure Laterality Date  . ABDOMINAL HYSTERECTOMY  2008  . ABLATION     uterine  . BREAST LUMPECTOMY WITH RADIOACTIVE SEED LOCALIZATION Right 06/07/2015   Procedure: BREAST LUMPECTOMY WITH RADIOACTIVE SEED LOCALIZATION;  Surgeon: Erroll Luna, MD;  Location: [facility];  Service: General;  Laterality: Right;  . CESAREAN SECTION    . KNEE ARTHROSCOPY  2011     right  . STAPEDECTOMY Bilateral 2011  . TUBAL LIGATION      OB History    No data available       Home Medications    Prior to Admission medications   Medication Sig Start Date End Date Taking? Authorizing Provider  albuterol (PROVENTIL HFA;VENTOLIN HFA) 108 (90 BASE) MCG/ACT inhaler Inhale 2 puffs into the lungs every 6 (six) hours as needed for wheezing or shortness of breath.    Yes Historical Provider, MD  albuterol (PROVENTIL) (2.5 MG/3ML) 0.083% nebulizer solution Take 2.5 mg by nebulization every 6 (six) hours as needed for wheezing or shortness of breath.   Yes Historical Provider, MD  allopurinol (ZYLOPRIM) 100 MG tablet Take 100 mg by mouth at bedtime.    Yes Historical Provider, MD  ALPRAZolam (XANAX) 0.25 MG tablet Take 0.125 mg by mouth every 6 (six) hours as needed for anxiety.    Yes Historical Provider, MD  Artificial Tear Ointment (REFRESH LACRI-LUBE) OINT Place 1 drop into both eyes at bedtime.   Yes Historical Provider, MD  carboxymethylcellulose (REFRESH PLUS) 0.5 % SOLN Place 1 drop into both eyes daily as needed (for dry eyes).    Yes Historical Provider, MD  Cholecalciferol (VITAMIN D3) 5000 units TABS Take 1 tablet by mouth daily.   Yes Historical Provider, MD  cycloSPORINE (RESTASIS) 0.05 % ophthalmic emulsion Place  1 drop into both eyes 2 (two) times daily.   Yes Historical Provider, MD  hydrochlorothiazide (MICROZIDE) 12.5 MG capsule Take 12.5 mg by mouth at bedtime.    Yes Historical Provider, MD  mometasone-formoterol (DULERA) 200-5 MCG/ACT AERO Inhale 2 puffs into the lungs 2 (two) times daily.   Yes Historical Provider, MD  montelukast (SINGULAIR) 10 MG tablet Take 10 mg by mouth at bedtime.   Yes Historical Provider, MD  omeprazole (PRILOSEC OTC) 20 MG tablet Take 20 mg by mouth at bedtime.   Yes Historical Provider, MD  oxybutynin (DITROPAN) 5 MG tablet Take 5 mg by mouth every 8 (eight) hours as needed for bladder spasms.   Yes Historical Provider, MD   tamoxifen (NOLVADEX) 20 MG tablet Take 20 mg by mouth at bedtime.  07/19/16  Yes Historical Provider, MD  acetaminophen (TYLENOL) 500 MG tablet Take 2 tablets (1,000 mg total) by mouth every 8 (eight) hours. Do not take more than 4000 mg of acetaminophen (Tylenol) in a 24-hour period. Please note that other medicines that you may be prescribed may have Tylenol as well. 08/09/16 08/14/16  Fatima Blank, MD  oxyCODONE-acetaminophen (PERCOCET/ROXICET) 5-325 MG tablet Take 2 tablets by mouth every 8 (eight) hours as needed for severe pain (when pain is not controlled with scheduled tylenol). Do not take more than 4000 mg of acetaminophen (Tylenol) in a 24-hour period. Please note that other medicines that you may be prescribed may have Tylenol as well. 08/09/16   Fatima Blank, MD    Family History Family History  Problem Relation Age of Onset  . COPD Mother   . Cancer Mother     breast    Social History Social History  Substance Use Topics  . Smoking status: Former Research scientist (life sciences)  . Smokeless tobacco: Never Used  . Alcohol use No     Allergies   Contrast media [iodinated diagnostic agents]; Latex; Shellfish allergy; Allopurinol; Tamiflu; Adhesive [tape]; and Aspirin   Review of Systems Review of Systems  Constitutional: Negative for diaphoresis, fatigue and fever.  HENT: Positive for facial swelling. Negative for trouble swallowing.   Eyes: Negative for visual disturbance.  Respiratory: Negative for shortness of breath.   Cardiovascular: Negative for chest pain.  Gastrointestinal: Negative for abdominal pain, nausea and vomiting.  Genitourinary: Negative for flank pain and pelvic pain.  Musculoskeletal: Positive for joint swelling, neck pain and neck stiffness. Negative for back pain.  Skin: Positive for wound.  Neurological: Negative for headaches.  All other systems reviewed and are negative.    Physical Exam Updated Vital Signs BP 126/75   Pulse 69   Temp 98.1 F  (36.7 C) (Oral)   Resp 16   SpO2 96%   Physical Exam  Constitutional: She is oriented to person, place, and time. She appears well-developed and well-nourished. No distress. Cervical collar in place.  HENT:  Head: Normocephalic. Head is with contusion (left periorbital) and with laceration (lower lip; wet vermilion laceration; approx 1.5 cm).  Right Ear: External ear normal.  Left Ear: External ear normal.  Nose: Nose normal.  Mouth/Throat:    Eyes: Conjunctivae and EOM are normal. Pupils are equal, round, and reactive to light. Right eye exhibits no discharge. Left eye exhibits no discharge. No scleral icterus.  Neck: Normal range of motion. Neck supple.  Cardiovascular: Normal rate, regular rhythm and normal heart sounds.  Exam reveals no gallop and no friction rub.   No murmur heard. Pulses:      Radial  pulses are 2+ on the right side, and 2+ on the left side.       Dorsalis pedis pulses are 2+ on the right side, and 2+ on the left side.  Pulmonary/Chest: Effort normal and breath sounds normal. No stridor. No respiratory distress. She has no wheezes.  Abdominal: Soft. She exhibits no distension. There is no tenderness.  Musculoskeletal: She exhibits no edema.       Left wrist: She exhibits decreased range of motion, tenderness and deformity.       Left knee: She exhibits ecchymosis.       Cervical back: She exhibits no bony tenderness.       Thoracic back: She exhibits no bony tenderness.       Lumbar back: She exhibits no bony tenderness.  Clavicles stable. Chest stable to AP/Lat compression Pelvis stable to Lat compression   Neurological: She is alert and oriented to person, place, and time.  Moving all extremities  Skin: Skin is warm and dry. No rash noted. She is not diaphoretic. No erythema.  Psychiatric: She has a normal mood and affect.     ED Treatments / Results  Labs (all labs ordered are listed, but only abnormal results are displayed) Labs Reviewed - No data  to display  EKG  EKG Interpretation None       Radiology Dg Forearm Left  Result Date: 08/09/2016 CLINICAL DATA:  Pain and deformity after a fall this morning. EXAM: LEFT FOREARM - 2 VIEW COMPARISON:  None. FINDINGS: There is a comminuted dorsally displaced and angulated fracture of the distal radial metaphysis. The fracture does not appear to involve the articular surface. Nondisplaced fracture through the base of the ulnar styloid. The proximal radius and ulna are normal. IMPRESSION: Fractures of the distal left radius and ulna as described. Electronically Signed   By: Lorriane Shire M.D.   On: 08/09/2016 10:02   Dg Wrist Complete Left  Result Date: 08/09/2016 CLINICAL DATA:  Status post reduction of a distal radius fracture due to a fall today. Initial encounter. EXAM: LEFT WRIST - COMPLETE 3+ VIEW COMPARISON:  Plain films of the left wrist earlier today. FINDINGS: The patient is now in a fiberglass cast. There is marked improvement in position and alignment of the patient's distal radius fracture with slight dorsal angulation and displacement noted. Soft tissue swelling about the wrist is identified. No new abnormality. IMPRESSION: Marked improvement in position and alignment after reduction of the patient's left radius fracture. No new abnormality. Electronically Signed   By: Inge Rise M.D.   On: 08/09/2016 15:15   Dg Wrist Complete Left  Result Date: 08/09/2016 CLINICAL DATA:  Left wrist pain and deformity secondary to a fall this morning. EXAM: LEFT WRIST - COMPLETE 3+ VIEW COMPARISON:  None. FINDINGS: There is an impacted comminuted dorsally displaced and angulated fracture of the metaphysis of the distal left radius. The articular surface is not involved. Nondisplaced fracture of the base of the ulnar styloid. Carpal bones are intact and not dislocated. IMPRESSION: Fractures of the distal left radius and ulna as described. Electronically Signed   By: Lorriane Shire M.D.   On:  08/09/2016 10:03   Dg Tibia/fibula Left  Result Date: 08/09/2016 CLINICAL DATA:  Left lower leg pain secondary to a fall this morning. EXAM: LEFT TIBIA AND FIBULA - 2 VIEW COMPARISON:  None. FINDINGS: There is no evidence of fracture or other focal bone lesions. Soft tissues are unremarkable. IMPRESSION: Negative. Electronically Signed   By:  Lorriane Shire M.D.   On: 08/09/2016 12:09   Ct Head Wo Contrast  Result Date: 08/09/2016 CLINICAL DATA:  60 year old female status post fall after tripping on her shoes. EXAM: CT HEAD WITHOUT CONTRAST CT MAXILLOFACIAL WITHOUT CONTRAST CT CERVICAL SPINE WITHOUT CONTRAST TECHNIQUE: Multidetector CT imaging of the head, cervical spine, and maxillofacial structures were performed using the standard protocol without intravenous contrast. Multiplanar CT image reconstructions of the cervical spine and maxillofacial structures were also generated. COMPARISON:  None. FINDINGS: CT HEAD FINDINGS Negative for acute intracranial hemorrhage, acute infarction, mass, mass effect, hydrocephalus or midline shift. Gray-white differentiation is preserved throughout. Mild periventricular and deep white matter hypoattenuation consistent with chronic microvascular ischemic white matter disease. No evidence of calvarial fracture or significant scalp hematoma. Normal aeration of the mastoid air cells and paranasal sinuses. CT MAXILLOFACIAL FINDINGS Focal reticulation of the subcutaneous soft tissues overlying the left supraorbital forehead, preseptal periorbital soft tissues on the left, and overlying the left zygoma. The globes and orbits appear intact and symmetric bilaterally. There is no evidence of acute facial fracture. CT CERVICAL SPINE FINDINGS No acute fracture, malalignment or prevertebral soft tissue swelling. Unremarkable CT appearance of the thyroid gland. No acute soft tissue abnormality. The lung apices are unremarkable. IMPRESSION: CT HEAD 1. No acute intracranial abnormality.  2. 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 CT FACE 1. Soft tissue contusion left supraorbital forehead, preseptal periorbital soft tissues and overlying the left zygoma. 2. No evidence of facial fracture or globe injury. CT CSPINE 1. Negative. Electronically Signed   By: Jacqulynn Cadet M.D.   On: 08/09/2016 10:58   Ct Cervical Spine Wo Contrast  Result Date: 08/09/2016 CLINICAL DATA:  60 year old female status post fall after tripping on her shoes. EXAM: CT HEAD WITHOUT CONTRAST CT MAXILLOFACIAL WITHOUT CONTRAST CT CERVICAL SPINE WITHOUT CONTRAST TECHNIQUE: Multidetector CT imaging of the head, cervical spine, and maxillofacial structures were performed using the standard protocol without intravenous contrast. Multiplanar CT image reconstructions of the cervical spine and maxillofacial structures were also generated. COMPARISON:  None. FINDINGS: CT HEAD FINDINGS Negative for acute intracranial hemorrhage, acute infarction, mass, mass effect, hydrocephalus or midline shift. Gray-white differentiation is preserved throughout. Mild periventricular and deep white matter hypoattenuation consistent with chronic microvascular ischemic white matter disease. No evidence of calvarial fracture or significant scalp hematoma. Normal aeration of the mastoid air cells and paranasal sinuses. CT MAXILLOFACIAL FINDINGS Focal reticulation of the subcutaneous soft tissues overlying the left supraorbital forehead, preseptal periorbital soft tissues on the left, and overlying the left zygoma. The globes and orbits appear intact and symmetric bilaterally. There is no evidence of acute facial fracture. CT CERVICAL SPINE FINDINGS No acute fracture, malalignment or prevertebral soft tissue swelling. Unremarkable CT appearance of the thyroid gland. No acute soft tissue abnormality. The lung apices are unremarkable. IMPRESSION: CT HEAD 1. No acute intracranial abnormality. 2. Mild chronic microvascular ischemic white  matter disease. CT FACE 1. Soft tissue contusion left supraorbital forehead, preseptal periorbital soft tissues and overlying the left zygoma. 2. No evidence of facial fracture or globe injury. CT CSPINE 1. Negative. Electronically Signed   By: Jacqulynn Cadet M.D.   On: 08/09/2016 10:58   Dg Knee Complete 4 Views Left  Result Date: 08/09/2016 CLINICAL DATA:  Left knee pain secondary to a fall this morning. EXAM: LEFT KNEE - COMPLETE 4+ VIEW COMPARISON:  None. FINDINGS: No evidence of fracture, dislocation, or joint effusion. No evidence of arthropathy or other focal bone abnormality. Slight soft  tissue swelling anterior to the patellar tendon. IMPRESSION: Soft tissue contusion. Electronically Signed   By: Lorriane Shire M.D.   On: 08/09/2016 12:08   Dg Hand Complete Left  Result Date: 08/09/2016 CLINICAL DATA:  Left wrist pain and deformity secondary to a fall this morning. EXAM: LEFT HAND - COMPLETE 3+ VIEW COMPARISON:  None. FINDINGS: There is an impacted dorsally displaced comminuted fracture of the distal left radius as well as a nondisplaced fracture through the base of the ulnar styloid. The bones of the hand are intact. IMPRESSION: Fractures of the distal left radius and ulna. Electronically Signed   By: Lorriane Shire M.D.   On: 08/09/2016 10:01   Ct Maxillofacial Wo Contrast  Result Date: 08/09/2016 CLINICAL DATA:  60 year old female status post fall after tripping on her shoes. EXAM: CT HEAD WITHOUT CONTRAST CT MAXILLOFACIAL WITHOUT CONTRAST CT CERVICAL SPINE WITHOUT CONTRAST TECHNIQUE: Multidetector CT imaging of the head, cervical spine, and maxillofacial structures were performed using the standard protocol without intravenous contrast. Multiplanar CT image reconstructions of the cervical spine and maxillofacial structures were also generated. COMPARISON:  None. FINDINGS: CT HEAD FINDINGS Negative for acute intracranial hemorrhage, acute infarction, mass, mass effect, hydrocephalus or  midline shift. Gray-white differentiation is preserved throughout. Mild periventricular and deep white matter hypoattenuation consistent with chronic microvascular ischemic white matter disease. No evidence of calvarial fracture or significant scalp hematoma. Normal aeration of the mastoid air cells and paranasal sinuses. CT MAXILLOFACIAL FINDINGS Focal reticulation of the subcutaneous soft tissues overlying the left supraorbital forehead, preseptal periorbital soft tissues on the left, and overlying the left zygoma. The globes and orbits appear intact and symmetric bilaterally. There is no evidence of acute facial fracture. CT CERVICAL SPINE FINDINGS No acute fracture, malalignment or prevertebral soft tissue swelling. Unremarkable CT appearance of the thyroid gland. No acute soft tissue abnormality. The lung apices are unremarkable. IMPRESSION: CT HEAD 1. No acute intracranial abnormality. 2. 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 CT FACE 1. Soft tissue contusion left supraorbital forehead, preseptal periorbital soft tissues and overlying the left zygoma. 2. No evidence of facial fracture or globe injury. CT CSPINE 1. Negative. Electronically Signed   By: Jacqulynn Cadet M.D.   On: 08/09/2016 10:58    Procedures Reduction of fracture Date/Time: 08/09/2016 1:55 PM Performed by: Fatima Blank Authorized by: Fatima Blank  Consent: Written consent obtained. Consent given by: patient Patient understanding: patient states understanding of the procedure being performed Patient consent: the patient's understanding of the procedure matches consent given Site marked: the operative site was marked Imaging studies: imaging studies available Patient identity confirmed: verbally with patient and arm band Time out: Immediately prior to procedure a "time out" was called to verify the correct patient, procedure, equipment, support staff and site/side marked as  required.  Sedation: Patient sedated: yes Sedatives: propofol Sedation start date/time: 08/09/2016 1:55 PM Sedation end date/time: 08/09/2016 2:30 PM Vitals: Vital signs were monitored during sedation. Patient tolerance: Patient tolerated the procedure well with no immediate complications  .Splint Application Date/Time: 99991111 1:55 PM Performed by: Fatima Blank Authorized by: Fatima Blank   Pre-procedure details:    Sensation:  Normal Procedure details:    Laterality:  Left   Location:  Wrist   Wrist:  L wrist   Splint type:  Sugar tong   Supplies:  Cotton padding, elastic bandage, sling and Ortho-Glass Post-procedure details:    Pain:  Improved   Sensation:  Normal   Skin color:  Normal   Patient tolerance of procedure:  Tolerated well, no immediate complications   (including critical care time)  Procedural sedation Performed by: Fatima Blank Consent: Verbal consent obtained. Risks and benefits: risks, benefits and alternatives were discussed Required items: required blood products, implants, devices, and/or special equipment available Patient identity confirmed: arm band and provided demographic data Time out: Immediately prior to procedure a "time out" was called to verify the correct patient, procedure, equipment, support staff and site/side marked as required.  Sedation type: moderate (conscious) sedation NPO time confirmed and considedered  Sedatives: Propofol  Physician Time at Bedside: 1350  Vitals: Vital signs were monitored during sedation. Cardiac Monitor, pulse oximeter Patient tolerance: Patient tolerated the procedure well with no immediate complications. Comments: Pt with uneventful recovered. Returned to pre-procedural sedation baseline   Medications Ordered in ED Medications  fentaNYL (SUBLIMAZE) injection 50 mcg (50 mcg Intravenous Given 08/09/16 0935)  fentaNYL (SUBLIMAZE) injection 50 mcg (50 mcg Intravenous Given  08/09/16 1027)  ondansetron (ZOFRAN) injection 4 mg (4 mg Intravenous Given 08/09/16 1027)  HYDROmorphone (DILAUDID) injection 1 mg (1 mg Intravenous Given 08/09/16 1116)  propofol (DIPRIVAN) 10 mg/mL bolus/IV push 200 mg (20 mg Intravenous Given 08/09/16 1355)  HYDROmorphone (DILAUDID) injection 0.5 mg (0.5 mg Intravenous Given 08/09/16 1344)  lidocaine (XYLOCAINE) 2 % (with pres) injection 200 mg (200 mg Infiltration Given by Other 08/09/16 1428)  propofol (DIPRIVAN) 10 mg/mL bolus/IV push (10 mg Intravenous Given 08/09/16 1401)  HYDROmorphone (DILAUDID) injection 0.5 mg (0.5 mg Intravenous Given 08/09/16 1519)     Initial Impression / Assessment and Plan / ED Course  I have reviewed the triage vital signs and the nursing notes.  Pertinent labs & imaging results that were available during my care of the patient were reviewed by me and considered in my medical decision making (see chart for details).  Clinical Course    Mechanical fall with head trauma, subluxed tooth, lip laceration, and left wrist deformity.  CT head, face, and cervical spine negative. Plain films with left distal radial and ulna fractures. No other injuries on imaging.  Provided with pain meds.  Fracture reduced under sedation. Post reduction film with improved alignment. Sugar tong splint applied.  Laceration closed by Karl Bales, PA.   Safe for discharge with close follow up with Orthopedics and Dentistry. Pt has already contacted her dentist who referred her to orhtrodontist.  Final Clinical Impressions(s) / ED Diagnoses   Final diagnoses:  Left wrist pain  Fall  Wrist fracture, left  Distal radius fracture, left, closed, initial encounter  Facial contusion, initial encounter  Subluxation of tooth  Lip laceration, initial encounter  Right distal ulnar fracture, closed, initial encounter   Disposition: Discharge  Condition: Good  I have discussed the results, Dx and Tx plan with the patient and son  who expressed understanding and agree(s) with the plan. Discharge instructions discussed at great length. The patient and son were given strict return precautions who verbalized understanding of the instructions. No further questions at time of discharge.    Discharge Medication List as of 08/09/2016  3:25 PM    START taking these medications   Details  acetaminophen (TYLENOL) 500 MG tablet Take 2 tablets (1,000 mg total) by mouth every 8 (eight) hours. Do not take more than 4000 mg of acetaminophen (Tylenol) in a 24-hour period. Please note that other medicines that you may be prescribed may have Tylenol as well., Starting Thu 08/09/2016, U ntil Tue 08/14/2016, Print  oxyCODONE-acetaminophen (PERCOCET/ROXICET) 5-325 MG tablet Take 2 tablets by mouth every 8 (eight) hours as needed for severe pain (when pain is not controlled with scheduled tylenol). Do not take more than 4000 mg of acetaminophen (Tylenol) in a 24-hour period. Please note that other medicines that you may be p rescribed may have Tylenol as well., Starting Thu 08/09/2016, Print        Follow Up: Vesta Mixer, PA-C [place] [id number] [phone]  Call  As needed  Daryll Brod, MD [place] [address] [phone]  Schedule an appointment as soon as possible for a visit in 1 day For close follow up to assess for further management of your wrist fracutre      Fatima Blank, MD 08/09/16 2153

## 2016-08-10 ENCOUNTER — Telehealth (HOSPITAL_BASED_OUTPATIENT_CLINIC_OR_DEPARTMENT_OTHER): Payer: Self-pay | Admitting: Emergency Medicine

## 2016-08-14 ENCOUNTER — Other Ambulatory Visit: Payer: Self-pay | Admitting: Orthopedic Surgery

## 2016-08-14 ENCOUNTER — Encounter (HOSPITAL_BASED_OUTPATIENT_CLINIC_OR_DEPARTMENT_OTHER): Payer: Self-pay | Admitting: *Deleted

## 2016-08-14 NOTE — [facility] Notes (Signed)
Chart reviewed by Dr Al Corpus, [place] for [facility], will get istat and EKG dos.

## 2016-08-16 ENCOUNTER — Ambulatory Visit (HOSPITAL_BASED_OUTPATIENT_CLINIC_OR_DEPARTMENT_OTHER): Payer: BLUE CROSS/BLUE SHIELD | Admitting: Anesthesiology

## 2016-08-16 ENCOUNTER — Encounter (HOSPITAL_BASED_OUTPATIENT_CLINIC_OR_DEPARTMENT_OTHER)
Admission: RE | Disposition: A | Discharge status: Home / Self Care | Payer: Self-pay | Source: Ambulatory Visit | Attending: Orthopedic Surgery

## 2016-08-16 ENCOUNTER — Encounter (HOSPITAL_BASED_OUTPATIENT_CLINIC_OR_DEPARTMENT_OTHER): Payer: Self-pay

## 2016-08-16 ENCOUNTER — Ambulatory Visit (HOSPITAL_BASED_OUTPATIENT_CLINIC_OR_DEPARTMENT_OTHER)
Admission: RE | Admit: 2016-08-16 | Discharge: 2016-08-16 | Disposition: A | Discharge status: Home / Self Care | Payer: BLUE CROSS/BLUE SHIELD | Source: Ambulatory Visit | Attending: Orthopedic Surgery | Admitting: Orthopedic Surgery

## 2016-08-16 DIAGNOSIS — Z79810 Long term (current) use of selective estrogen receptor modulators (SERMs): Secondary | ICD-10-CM | POA: Diagnosis not present

## 2016-08-16 DIAGNOSIS — W1830XA Fall on same level, unspecified, initial encounter: Secondary | ICD-10-CM | POA: Insufficient documentation

## 2016-08-16 DIAGNOSIS — K219 Gastro-esophageal reflux disease without esophagitis: Secondary | ICD-10-CM | POA: Diagnosis not present

## 2016-08-16 DIAGNOSIS — Z6837 Body mass index (BMI) 37.0-37.9, adult: Secondary | ICD-10-CM | POA: Diagnosis not present

## 2016-08-16 DIAGNOSIS — Z79899 Other long term (current) drug therapy: Secondary | ICD-10-CM | POA: Insufficient documentation

## 2016-08-16 DIAGNOSIS — F419 Anxiety disorder, unspecified: Secondary | ICD-10-CM | POA: Diagnosis not present

## 2016-08-16 DIAGNOSIS — I10 Essential (primary) hypertension: Secondary | ICD-10-CM | POA: Diagnosis not present

## 2016-08-16 DIAGNOSIS — Z87891 Personal history of nicotine dependence: Secondary | ICD-10-CM | POA: Diagnosis not present

## 2016-08-16 DIAGNOSIS — J45909 Unspecified asthma, uncomplicated: Secondary | ICD-10-CM | POA: Diagnosis not present

## 2016-08-16 DIAGNOSIS — Z7951 Long term (current) use of inhaled steroids: Secondary | ICD-10-CM | POA: Diagnosis not present

## 2016-08-16 DIAGNOSIS — S52552A Other extraarticular fracture of lower end of left radius, initial encounter for closed fracture: Secondary | ICD-10-CM | POA: Diagnosis not present

## 2016-08-16 HISTORY — DX: Prediabetes: R73.03

## 2016-08-16 HISTORY — PX: OPEN REDUCTION INTERNAL FIXATION (ORIF) DISTAL RADIAL FRACTURE: SHX5989

## 2016-08-16 LAB — POCT I-STAT, CHEM 8
CALCIUM ION: 1.1 mmol/L — AB (ref 1.12–1.23)
CREATININE: 0.6 mg/dL (ref 0.44–1.00)
Chloride: 100 mmol/L — ABNORMAL LOW (ref 101–111)
GLUCOSE: 80 mg/dL (ref 65–99)
HEMATOCRIT: 47 % — AB (ref 36.0–46.0)
HEMOGLOBIN: 16 g/dL — AB (ref 12.0–15.0)
Potassium: 3 mmol/L — ABNORMAL LOW (ref 3.5–5.1)
Sodium: 141 mmol/L (ref 135–145)
TCO2: 26 mmol/L (ref 0–100)

## 2016-08-16 SURGERY — OPEN REDUCTION INTERNAL FIXATION (ORIF) DISTAL RADIUS FRACTURE
Anesthesia: Regional | Site: Wrist | Laterality: Left

## 2016-08-16 MED ORDER — CEFAZOLIN SODIUM-DEXTROSE 2-4 GM/100ML-% IV SOLN
INTRAVENOUS | Status: AC
  Filled 2016-08-16: qty 100

## 2016-08-16 MED ORDER — GLYCOPYRROLATE 0.2 MG/ML IJ SOLN: 0.2000 mg | Freq: Once | INTRAMUSCULAR | Status: DC | PRN

## 2016-08-16 MED ORDER — FENTANYL CITRATE (PF) 100 MCG/2ML IJ SOLN: 25.0000 ug | INTRAMUSCULAR | Status: DC | PRN

## 2016-08-16 MED ORDER — LIDOCAINE 2% (20 MG/ML) 5 ML SYRINGE
INTRAMUSCULAR | Status: AC
  Filled 2016-08-16: qty 5

## 2016-08-16 MED ORDER — FENTANYL CITRATE (PF) 100 MCG/2ML IJ SOLN
INTRAMUSCULAR | Status: AC
  Filled 2016-08-16: qty 2

## 2016-08-16 MED ORDER — DEXAMETHASONE SODIUM PHOSPHATE 10 MG/ML IJ SOLN
INTRAMUSCULAR | Status: AC
  Filled 2016-08-16: qty 1

## 2016-08-16 MED ORDER — FENTANYL CITRATE (PF) 100 MCG/2ML IJ SOLN
INTRAMUSCULAR | Status: AC
Start: 2016-08-16 — End: 2016-08-16
  Filled 2016-08-16: qty 2

## 2016-08-16 MED ORDER — ONDANSETRON HCL 4 MG/2ML IJ SOLN
INTRAMUSCULAR | Status: AC
  Filled 2016-08-16: qty 2

## 2016-08-16 MED ORDER — MIDAZOLAM HCL 5 MG/5ML IJ SOLN
INTRAMUSCULAR | Status: DC | PRN
  Administered 2016-08-16: 2 mg via INTRAVENOUS

## 2016-08-16 MED ORDER — SCOPOLAMINE 1 MG/3DAYS TD PT72: 1.0000 | MEDICATED_PATCH | Freq: Once | TRANSDERMAL | Status: DC | PRN

## 2016-08-16 MED ORDER — CHLORHEXIDINE GLUCONATE 4 % EX LIQD: 60.0000 mL | Freq: Once | CUTANEOUS | Status: DC

## 2016-08-16 MED ORDER — 0.9 % SODIUM CHLORIDE (POUR BTL) OPTIME
TOPICAL | Status: DC | PRN
  Administered 2016-08-16: 250 mL

## 2016-08-16 MED ORDER — FENTANYL CITRATE (PF) 100 MCG/2ML IJ SOLN
50.0000 ug | INTRAMUSCULAR | Status: DC | PRN
  Administered 2016-08-16: 50 ug via INTRAVENOUS

## 2016-08-16 MED ORDER — MIDAZOLAM HCL 2 MG/2ML IJ SOLN
INTRAMUSCULAR | Status: AC
  Filled 2016-08-16: qty 2

## 2016-08-16 MED ORDER — MIDAZOLAM HCL 2 MG/2ML IJ SOLN
1.0000 mg | INTRAMUSCULAR | Status: DC | PRN
  Administered 2016-08-16: 1 mg via INTRAVENOUS

## 2016-08-16 MED ORDER — LACTATED RINGERS IV SOLN
INTRAVENOUS | Status: DC
  Administered 2016-08-16: 12:00:00 via INTRAVENOUS

## 2016-08-16 MED ORDER — PROPOFOL 500 MG/50ML IV EMUL
INTRAVENOUS | Status: AC
  Filled 2016-08-16: qty 50

## 2016-08-16 MED ORDER — CEFAZOLIN SODIUM-DEXTROSE 2-4 GM/100ML-% IV SOLN
2.0000 g | INTRAVENOUS | Status: AC
  Administered 2016-08-16: 2 g via INTRAVENOUS

## 2016-08-16 MED ORDER — ONDANSETRON HCL 4 MG/2ML IJ SOLN: 4.0000 mg | Freq: Once | INTRAMUSCULAR | Status: DC | PRN

## 2016-08-16 MED ORDER — PROPOFOL 500 MG/50ML IV EMUL
INTRAVENOUS | Status: DC | PRN
  Administered 2016-08-16: 25 ug/kg/min via INTRAVENOUS

## 2016-08-16 MED ORDER — BUPIVACAINE-EPINEPHRINE (PF) 0.5% -1:200000 IJ SOLN
INTRAMUSCULAR | Status: DC | PRN
  Administered 2016-08-16: 25 mL via PERINEURAL

## 2016-08-16 SURGICAL SUPPLY — 70 items
BANDAGE ACE 3X5.8 VEL STRL LF (GAUZE/BANDAGES/DRESSINGS) ×2 IMPLANT
BIT DRILL 2.0 LNG QUCK RELEASE (BIT) IMPLANT
BIT DRILL 2.8X5 QR DISP (BIT) ×1 IMPLANT
BLADE SURG 15 STRL LF DISP TIS (BLADE) ×2 IMPLANT
BLADE SURG 15 STRL SS (BLADE) ×4
BNDG CMPR 9X4 STRL LF SNTH (GAUZE/BANDAGES/DRESSINGS) ×1
BNDG ESMARK 4X9 LF (GAUZE/BANDAGES/DRESSINGS) ×2 IMPLANT
BNDG GAUZE ELAST 4 BULKY (GAUZE/BANDAGES/DRESSINGS) ×2 IMPLANT
BNDG PLASTER X FAST 3X3 WHT LF (CAST SUPPLIES) ×10 IMPLANT
BNDG PLSTR 9X3 FST ST WHT (CAST SUPPLIES) ×10
CHLORAPREP W/TINT 26ML (MISCELLANEOUS) ×2 IMPLANT
CORDS BIPOLAR (ELECTRODE) ×2 IMPLANT
COVER BACK TABLE 60X90IN (DRAPES) ×2 IMPLANT
COVER MAYO STAND STRL (DRAPES) ×2 IMPLANT
CUFF TOURNIQUET SINGLE 18IN (TOURNIQUET CUFF) ×1 IMPLANT
DRAPE EXTREMITY T 121X128X90 (DRAPE) ×2 IMPLANT
DRAPE OEC MINIVIEW 54X84 (DRAPES) ×2 IMPLANT
DRAPE SURG 17X23 STRL (DRAPES) ×2 IMPLANT
DRILL 2.0 LNG QUICK RELEASE (BIT) ×2
GAUZE SPONGE 4X4 12PLY STRL (GAUZE/BANDAGES/DRESSINGS) ×2 IMPLANT
GAUZE XEROFORM 1X8 LF (GAUZE/BANDAGES/DRESSINGS) ×2 IMPLANT
GLOVE BIO SURGEON STRL SZ7.5 (GLOVE) ×1 IMPLANT
GLOVE BIOGEL PI IND STRL 8 (GLOVE) ×1 IMPLANT
GLOVE BIOGEL PI IND STRL 8.5 (GLOVE) IMPLANT
GLOVE BIOGEL PI INDICATOR 8 (GLOVE) ×2
GLOVE BIOGEL PI INDICATOR 8.5 (GLOVE) ×1
GLOVE SURG ORTHO 8.0 STRL STRW (GLOVE) IMPLANT
GLOVE SURG SS PI 7.5 STRL IVOR (GLOVE) ×3 IMPLANT
GLOVE SURG SS PI 8.0 STRL IVOR (GLOVE) ×1 IMPLANT
GOWN STRL REUS W/ TWL LRG LVL3 (GOWN DISPOSABLE) ×1 IMPLANT
GOWN STRL REUS W/ TWL XL LVL3 (GOWN DISPOSABLE) IMPLANT
GOWN STRL REUS W/TWL LRG LVL3 (GOWN DISPOSABLE)
GOWN STRL REUS W/TWL XL LVL3 (GOWN DISPOSABLE) ×4 IMPLANT
GUIDEWIRE ORTHO 0.054X6 (WIRE) ×3 IMPLANT
NDL HYPO 25X1 1.5 SAFETY (NEEDLE) IMPLANT
NEEDLE HYPO 25X1 1.5 SAFETY (NEEDLE) IMPLANT
NS IRRIG 1000ML POUR BTL (IV SOLUTION) ×2 IMPLANT
PACK BASIN DAY SURGERY FS (CUSTOM PROCEDURE TRAY) ×2 IMPLANT
PAD CAST 3X4 CTTN HI CHSV (CAST SUPPLIES) ×1 IMPLANT
PADDING CAST ABS 4INX4YD NS (CAST SUPPLIES) ×1
PADDING CAST ABS COTTON 4X4 ST (CAST SUPPLIES) ×1 IMPLANT
PADDING CAST COTTON 3X4 STRL (CAST SUPPLIES) ×2
PLATE PROX NARROW LEFT (Plate) ×1 IMPLANT
SCREW ACTK 2 NL HEX 3.5.11 (Screw) ×1 IMPLANT
SCREW CORT FT 18X2.3XLCK HEX (Screw) IMPLANT
SCREW CORT FX14X2.3XLCK NS (Screw) IMPLANT
SCREW CORTICAL LOCKING 2.3X14M (Screw) ×2 IMPLANT
SCREW CORTICAL LOCKING 2.3X16M (Screw) ×6 IMPLANT
SCREW CORTICAL LOCKING 2.3X18M (Screw) ×4 IMPLANT
SCREW FX16X2.3XLCK SMTH NS CRT (Screw) IMPLANT
SCREW NLCKG 13 3.5X13 HEXA (Screw) IMPLANT
SCREW NON-LOCK 3.5X13 (Screw) ×2 IMPLANT
SCREW NONLOCK HEX 3.5X12 (Screw) ×1 IMPLANT
SLEEVE SCD COMPRESS KNEE MED (MISCELLANEOUS) ×1 IMPLANT
STOCKINETTE 4X48 STRL (DRAPES) ×2 IMPLANT
SUCTION FRAZIER HANDLE 10FR (MISCELLANEOUS)
SUCTION TUBE FRAZIER 10FR DISP (MISCELLANEOUS) IMPLANT
SUT ETHILON 3 0 PS 1 (SUTURE) IMPLANT
SUT ETHILON 4 0 PS 2 18 (SUTURE) ×2 IMPLANT
SUT VIC AB 2-0 SH 27 (SUTURE)
SUT VIC AB 2-0 SH 27XBRD (SUTURE) IMPLANT
SUT VIC AB 3-0 PS1 18 (SUTURE)
SUT VIC AB 3-0 PS1 18XBRD (SUTURE) IMPLANT
SUT VICRYL 4-0 PS2 18IN ABS (SUTURE) ×2 IMPLANT
SYR BULB 3OZ (MISCELLANEOUS) ×2 IMPLANT
SYR CONTROL 10ML LL (SYRINGE) IMPLANT
TOWEL OR 17X24 6PK STRL BLUE (TOWEL DISPOSABLE) ×4 IMPLANT
TOWEL OR NON WOVEN STRL DISP B (DISPOSABLE) ×2 IMPLANT
TUBE CONNECTING 20X1/4 (TUBING) IMPLANT
UNDERPAD 30X30 (UNDERPADS AND DIAPERS) ×2 IMPLANT

## 2016-08-16 NOTE — Op Note (Signed)
08/16/2016 [facility]  Operative Note  Pre Op Diagnosis: Left extraarticular distal radius fracture  Post Op Diagnosis: Left extraarticular distal radius fracture  Procedure: ORIF Left extraarticular distal radius fracture   Surgeon: Leanora Cover, MD  Assistant: Daryll Brod, MD  Anesthesia: Regional and iv sedation  Fluids: Per anesthesia flow sheet  EBL: minimal  Complications: None  Specimen: None  Tourniquet Time:  Total Tourniquet Time Documented: Upper Arm (Left) - 36 minutes Total: Upper Arm (Left) - 36 minutes   Disposition: Stable to PACU  INDICATIONS: INDICATIONS: @ is a 60 y.o. female fill proximally 1 week ago injuring her left wrist. She was seen at the emergency department were radiographs were taken revealing a distal radius fracture. She was splinted and followed-up in the office.  We discussed nonoperative and operative treatment options. She wished to proceed with operative fixation.  Risks, benefits, and alternatives of surgery were discussed including the risk of blood loss; infection; damage to nerves, vessels, tendons, ligaments, bone; failure of surgery; need for additional surgery; complications with wound healing; continued pain; nonunion; malunion; stiffness.  We also discussed the possible need for bone graft and the benefits and risks including the possibility of disease transmission.  She voiced understanding of these risks and elected to proceed.   OPERATIVE COURSE:  After being identified preoperatively by myself, the patient and I agreed upon the procedure and site of procedure.  Surgical site was marked.  The risks, benefits and alternatives of the surgery were reviewed and she wished to proceed.  Surgical consent had been signed.  She was given IV Ancef as preoperative antibiotic prophylaxis.  She was transferred to the operating room and placed on the operating room table in supine position with the Left upper extremity on an  armboard. Regional and iv sedation anesthesia was induced by the anesthesiologist.  The Left upper extremity was prepped and draped in normal sterile orthopedic fashion.  A surgical pause was performed between the surgeons, anesthesia and operating room staff, and all were in agreement as to the patient, procedure and site of procedure.  Tourniquet at the proximal aspect of the extremity was inflated to 250 mmHg after exsanguination of the limb with an Esmarch bandage.  Standard volar Mallie Mussel approach was used.  The bipolar electrocautery was used to obtain hemostasis.  The superficial and deep portions of the FCR tendon sheath were incised, and the FCR and FPL were swept ulnarly to protect the palmar cutaneous branch of the median nerve.  The brachioradialis was released at the radial side of the radius.  The pronator quadratus was released and elevated with the periosteal elevator.  The fracture site was identified and cleared of soft tissue interposition and hematoma.  It was reduced under direct visualization.  There was dorsal comminution.  There did not appear to be articular extension.   An AcuMed volar distal radial locking plate was selected.  It was secured to the bone with the guidepins.  C-arm was used in AP and lateral projections to ensure appropriate reduction and position of the hardware and adjustments made as necessary.  Standard AO drilling and measuring technique was used.  A single screw was placed in the slotted hole in the shaft of the plate.  The distal holes were filled with locking pegs with the exception of the styloid holes, which were filled with locking screws.  The remaining holes in the shaft of the plate were filled with nonlocking screws.  Good purchase was obtained.  C-arm was used in AP, lateral and oblique projections to ensure appropriate reduction and position of hardware, which was the case.  There was no intra-articular penetration.  The wound was copiously irrigated with  sterile saline.  Pronator quadratus was repaired back over top of the plate using 4-0 Vicryl suture.  Vicryl suture was placed in the subcutaneous tissues in an inverted interrupted fashion and the skin was closed with 4-0 nylon in a horizontal mattress fashion.  There was good pronation and supination of the wrist without crepitance.  The wound was then dressed with sterile Xeroform, 4x4s, and wrapped with a Kerlix bandage.  A volar splint was placed and wrapped with Kerlix and Ace bandage.  Tourniquet was deflated at 34 minutes.  Fingertips were pink with brisk capillary refill after deflation of the tourniquet.  Operative drapes were broken down.  The patient was awoken from anesthesia safely.  He was transferred back to the stretcher and taken to the PACU in stable condition.  I will see him back in the office in one week for postoperative followup.  She has pain medication at home already from the emergency department.

## 2016-08-16 NOTE — Anesthesia Procedure Notes (Signed)
Procedure Name: MAC Date/Time: 08/16/2016 1:12 PM Performed by: Marrianne Mood Pre-anesthesia Checklist: Patient identified, Timeout performed, Emergency Drugs available, Suction available and Patient being monitored Oxygen Delivery Method: Simple face mask

## 2016-08-16 NOTE — Discharge Instructions (Signed)
Wound Care: Keep your hand elevated above the level of your heart.  Do not allow it to dangle by your side.  Keep the dressing dry and do not remove it unless your doctor advises you to do so.  He will usually change it at the time of your post-op visit.  Moving your fingers is advised to stimulate circulation but will depend on the site of your surgery.  If you have a splint applied, your doctor will advise you regarding movement.  Activity: Do not drive or operate machinery today.  Rest today and then you may return to your normal activity and work as indicated by your physician.  Diet:  Drink liquids today or eat a light diet.  You may resume a regular diet tomorrow.    General expectations: Pain for two to three days. Fingers may become slightly swollen.  Call your doctor if any of the following occur: Severe pain not relieved by pain medication. Elevated temperature. Dressing soaked with blood. Inability to move fingers. White or bluish color to fingers.   Post Anesthesia Home Care Instructions  Activity: Get plenty of rest for the remainder of the day. A responsible adult should stay with you for 24 hours following the procedure.  For the next 24 hours, DO NOT: -Drive a car -Paediatric nurse -Drink alcoholic beverages -Take any medication unless instructed by your physician -Make any legal decisions or sign important papers.  Meals: Start with liquid foods such as gelatin or soup. Progress to regular foods as tolerated. Avoid greasy, spicy, heavy foods. If nausea and/or vomiting occur, drink only clear liquids until the nausea and/or vomiting subsides. Call your physician if vomiting continues.  Special Instructions/Symptoms: Your throat may feel dry or sore from the anesthesia or the breathing tube placed in your throat during surgery. If this causes discomfort, gargle with warm salt water. The discomfort should disappear within 24 hours.  If you had a scopolamine patch  placed behind your ear for the management of post- operative nausea and/or vomiting:  1. The medication in the patch is effective for 72 hours, after which it should be removed.  Wrap patch in a tissue and discard in the trash. Wash hands thoroughly with soap and water. 2. You may remove the patch earlier than 72 hours if you experience unpleasant side effects which may include dry mouth, dizziness or visual disturbances. 3. Avoid touching the patch. Wash your hands with soap and water after contact with the patch.   Regional Anesthesia Blocks  1. Numbness or the inability to move the "blocked" extremity may last from 3-48 hours after placement. The length of time depends on the medication injected and your individual response to the medication. If the numbness is not going away after 48 hours, call your surgeon.  2. The extremity that is blocked will need to be protected until the numbness is gone and the  Strength has returned. Because you cannot feel it, you will need to take extra care to avoid injury. Because it may be weak, you may have difficulty moving it or using it. You may not know what position it is in without looking at it while the block is in effect.  3. For blocks in the legs and feet, returning to weight bearing and walking needs to be done carefully. You will need to wait until the numbness is entirely gone and the strength has returned. You should be able to move your leg and foot normally before you try and  bear weight or walk. You will need someone to be with you when you first try to ensure you do not fall and possibly risk injury.  4. Bruising and tenderness at the needle site are common side effects and will resolve in a few days.  5. Persistent numbness or new problems with movement should be communicated to the surgeon or the [facility] [phone] [place] [phone]).  [facility] Instructions Hand Surgery  Wound Care: Keep your  hand elevated above the level of your heart.  Do not allow it to dangle by your side.  Keep the dressing dry and do not remove it unless your doctor advises you to do so.  He will usually change it at the time of your post-op visit.  Moving your fingers is advised to stimulate circulation but will depend on the site of your surgery.  If you have a splint applied, your doctor will advise you regarding movement.  Activity: Do not drive or operate machinery today.  Rest today and then you may return to your normal activity and work as indicated by your physician.  Diet:  Drink liquids today or eat a light diet.  You may resume a regular diet tomorrow.    General expectations: Pain for two to three days. Fingers may become slightly swollen.  Call your doctor if any of the following occur: Severe pain not relieved by pain medication. Elevated temperature. Dressing soaked with blood. Inability to move fingers. White or bluish color to fingers.

## 2016-08-16 NOTE — H&P (Signed)
Beth Williams is an 60 y.o. female.   Chief Complaint: left distal radius fracture HPI: 60 yo rhd female states she fell from standing height 08/09/16 injuring left wrist.  Seen at [facility] where XR revealed left distal radius fracture.  Splinted and followed up in office.    Allergies:  Allergies  Allergen Reactions  . Contrast Media [Iodinated Diagnostic Agents] Hives, Swelling and Other (See Comments)    Headache - unknown swelling site   . Latex Rash  . Shellfish Allergy Hives, Swelling and Other (See Comments)    Headache - unknown swelling site   . Allopurinol Swelling    Swelling of the face - can only take 100mg  a day or she will swell   . Tamiflu Hives  . Adhesive [Tape]     Steri strips and bandaids  . Aspirin Swelling    Swelling of the lips and eyes     Past Medical History:  Diagnosis Date  . Anxiety   . Asthma   . Cancer ([place])    right breat  . Claustrophobia   . Depression   . GERD (gastroesophageal reflux disease)   . Heart murmur    heart murmur, goes to [facility] in [facility], Dr Maia Breslow  . Hypertension   . Prediabetes     Past Surgical History:  Procedure Laterality Date  . ABDOMINAL HYSTERECTOMY  2008  . ABLATION     uterine  . BREAST LUMPECTOMY WITH RADIOACTIVE SEED LOCALIZATION Right 06/07/2015   Procedure: BREAST LUMPECTOMY WITH RADIOACTIVE SEED LOCALIZATION;  Surgeon: Erroll Luna, MD;  Location: [place];  Service: General;  Laterality: Right;  . CESAREAN SECTION    . KNEE ARTHROSCOPY  2011   right  . STAPEDECTOMY Bilateral 2011  . TUBAL LIGATION      Family History: Family History  Problem Relation Age of Onset  . COPD Mother   . Cancer Mother     breast    Social History:   reports that she has quit smoking. She has never used smokeless tobacco. She reports that she does not drink alcohol or use drugs.  Medications: Medications Prior to Admission  Medication Sig Dispense Refill  . allopurinol  (ZYLOPRIM) 100 MG tablet Take 100 mg by mouth at bedtime.     . ALPRAZolam (XANAX) 0.25 MG tablet Take 0.125 mg by mouth every 6 (six) hours as needed for anxiety.     . Artificial Tear Ointment (REFRESH LACRI-LUBE) OINT Place 1 drop into both eyes at bedtime.    . carboxymethylcellulose (REFRESH PLUS) 0.5 % SOLN Place 1 drop into both eyes daily as needed (for dry eyes).     . Cholecalciferol (VITAMIN D3) 5000 units TABS Take 1 tablet by mouth daily.    . cycloSPORINE (RESTASIS) 0.05 % ophthalmic emulsion Place 1 drop into both eyes 2 (two) times daily.    . hydrochlorothiazide (MICROZIDE) 12.5 MG capsule Take 12.5 mg by mouth at bedtime.     . mometasone-formoterol (DULERA) 200-5 MCG/ACT AERO Inhale 2 puffs into the lungs 2 (two) times daily.    . montelukast (SINGULAIR) 10 MG tablet Take 10 mg by mouth at bedtime.    Marland Kitchen omeprazole (PRILOSEC OTC) 20 MG tablet Take 20 mg by mouth at bedtime.    . tamoxifen (NOLVADEX) 20 MG tablet Take 20 mg by mouth at bedtime.   11  . albuterol (PROVENTIL HFA;VENTOLIN HFA) 108 (90 BASE) MCG/ACT inhaler Inhale 2 puffs into the lungs every 6 (  six) hours as needed for wheezing or shortness of breath.     Marland Kitchen albuterol (PROVENTIL) (2.5 MG/3ML) 0.083% nebulizer solution Take 2.5 mg by nebulization every 6 (six) hours as needed for wheezing or shortness of breath.    . oxybutynin (DITROPAN) 5 MG tablet Take 5 mg by mouth every 8 (eight) hours as needed for bladder spasms.    Marland Kitchen oxyCODONE-acetaminophen (PERCOCET/ROXICET) 5-325 MG tablet Take 2 tablets by mouth every 8 (eight) hours as needed for severe pain (when pain is not controlled with scheduled tylenol). Do not take more than 4000 mg of acetaminophen (Tylenol) in a 24-hour period. Please note that other medicines that you may be prescribed may have Tylenol as well. 15 tablet 0    No results found for this or any previous visit (from the past 48 hour(s)).  No results found.   A comprehensive review of systems was  negative.  Height 5\' 1"  (1.549 m), weight 90.3 kg (199 lb).  General appearance: alert, cooperative and appears stated age Head: Normocephalic, without obvious abnormality, atraumatic Neck: supple, symmetrical, trachea midline Resp: clear to auscultation bilaterally Cardio: regular rate and rhythm GI: non-tender Extremities: Intact sensation and capillary refill all digits.  +epl/fpl/io.  No wounds.  Pulses: 2+ and symmetric Skin: Skin color, texture, turgor normal. No rashes or lesions Neurologic: Grossly normal Incision/Wound:none  Assessment/Plan Left distal radius fracture.  Non operative and operative treatment options were discussed with the patient and patient wishes to proceed with operative treatment. Risks, benefits, and alternatives of surgery were discussed and the patient agrees with the plan of care.  Kendell Sagraves R 08/16/2016, 11:05 AM

## 2016-08-16 NOTE — Progress Notes (Signed)
Assisted Dr. [place] Pink with left, ultrasound guided, supraclavicular block. Side rails up, monitors on throughout procedure. See vital signs in flow sheet. Tolerated Procedure well.

## 2016-08-16 NOTE — Transfer of Care (Signed)
Immediate Anesthesia Transfer of Care Note  Patient: Beth Williams  Procedure(s) Performed: Procedure(s): OPEN REDUCTION INTERNAL FIXATION (ORIF) LEFT DISTAL RADIAL FRACTURE (Left)  Patient Location: PACU  Anesthesia Type:Regional  Level of Consciousness: awake and patient cooperative  Airway & Oxygen Therapy: Patient Spontanous Breathing and Patient connected to face mask oxygen  Post-op Assessment: Report given to RN and Post -op Vital signs reviewed and stable  Post vital signs: Reviewed and stable  Last Vitals:  Vitals:   08/16/16 1315 08/16/16 1330  BP: 136/79 132/86  Pulse: 92 100  Resp: 17 16  Temp:      Last Pain:  Vitals:   08/16/16 1140  TempSrc: Oral  PainSc: 7       Patients Stated Pain Goal: 2 ([id number])  Complications: No apparent anesthesia complications

## 2016-08-16 NOTE — Anesthesia Procedure Notes (Signed)
Anesthesia Regional Block:  Supraclavicular block  Pre-Anesthetic Checklist: ,, timeout performed, Correct Patient, Correct Site, Correct Laterality, Correct Procedure, Correct Position, site marked, Risks and benefits discussed,  Surgical consent,  Pre-op evaluation,  At surgeon's request and post-op pain management  Laterality: Left  Prep: chloraprep       Needles:  Injection technique: Single-shot  Needle Type: Stimiplex     Needle Length: 9cm 9 cm Needle Gauge: 21 G    Additional Needles:  Procedures: ultrasound guided (picture in chart) Supraclavicular block Narrative:  Injection made incrementally with aspirations every 5 mL.  Performed by: Personally  Anesthesiologist: Rieley Hausman  Additional Notes: Risks, benefits and alternative to block explained extensively.  Patient tolerated procedure well, without complications.

## 2016-08-16 NOTE — Op Note (Signed)
I assisted Surgeon(s) and Role:    * Leanora Cover, MD - Primary    * Daryll Brod, MD on the Procedure(s): OPEN REDUCTION INTERNAL FIXATION (ORIF) LEFT DISTAL RADIAL FRACTURE on 08/16/2016.  I provided assistance on this case as follows: approach, retraction, dissection to the fracture, tenotomy, isolation of the fracture, reduction of the fracture after debridement, stabilization of the fracture, application of the plate, radiographic interpretation,closure of the wound and application of dressing and splint. I was present for the entire case.  Electronically signed by: Wynonia Sours, MD Date: 08/16/2016 Time: 4:03 PM

## 2016-08-16 NOTE — Brief Op Note (Signed)
08/16/2016  4:03 PM  PATIENT:  Beth Williams  60 y.o. female  PRE-OPERATIVE DIAGNOSIS:  left distal radius fracture  POST-OPERATIVE DIAGNOSIS:  left distal radius fracture  PROCEDURE:  Procedure(s): OPEN REDUCTION INTERNAL FIXATION (ORIF) LEFT DISTAL RADIAL FRACTURE (Left)  SURGEON:  Surgeon(s) and Role:    * Leanora Cover, MD - Primary    * Daryll Brod, MD  PHYSICIAN ASSISTANT:   ASSISTANTS: Daryll Brod, MD   ANESTHESIA:   regional and IV sedation  EBL:  Total I/O In: 800 [I.V.:800] Out: 4 [Blood:4]  BLOOD ADMINISTERED:none  DRAINS: none   LOCAL MEDICATIONS USED:  NONE  SPECIMEN:  No Specimen  DISPOSITION OF SPECIMEN:  N/A  COUNTS:  YES  TOURNIQUET:   Total Tourniquet Time Documented: Upper Arm (Left) - 36 minutes Total: Upper Arm (Left) - 36 minutes   DICTATION: .Note written in EPIC  PLAN OF CARE: Discharge to home after PACU  PATIENT DISPOSITION:  PACU - hemodynamically stable.

## 2016-08-16 NOTE — Anesthesia Procedure Notes (Signed)
Procedure Name: MAC Date/Time: 08/16/2016 3:15 PM Performed by: Marrianne Mood Pre-anesthesia Checklist: Patient identified, Timeout performed, Emergency Drugs available, Suction available and Patient being monitored Patient Re-evaluated:Patient Re-evaluated prior to inductionOxygen Delivery Method: Simple face mask

## 2016-08-16 NOTE — Anesthesia Preprocedure Evaluation (Addendum)
Anesthesia Evaluation  Patient identified by MRN, date of birth, ID band Patient awake    Reviewed: Allergy & Precautions, NPO status , Patient's Chart, lab work & pertinent test results  Airway Mallampati: II   Neck ROM: full    Dental  (+) Teeth Intact   Pulmonary asthma , former smoker,    breath sounds clear to auscultation       Cardiovascular hypertension,  Rhythm:regular Rate:Normal  She has trace MR per Echo 2017, has seen cardiology and does not need further cardiology follow up for her trace MR   Neuro/Psych Anxiety Depression    GI/Hepatic GERD  ,  Endo/Other  Morbid obesity  Renal/GU      Musculoskeletal   Abdominal   Peds  Hematology   Anesthesia Other Findings   Reproductive/Obstetrics                            Anesthesia Physical  Anesthesia Plan  ASA: II  Anesthesia Plan: Regional and MAC   Post-op Pain Management:    Induction: Intravenous  Airway Management Planned: Simple Face Mask  Additional Equipment:   Intra-op Plan:   Post-operative Plan:   Informed Consent: I have reviewed the patients History and Physical, chart, labs and discussed the procedure including the risks, benefits and alternatives for the proposed anesthesia with the patient or authorized representative who has indicated his/her understanding and acceptance.     Plan Discussed with: CRNA, Anesthesiologist and Surgeon  Anesthesia Plan Comments:       Anesthesia Quick Evaluation

## 2016-08-16 NOTE — Anesthesia Postprocedure Evaluation (Signed)
Anesthesia Post Note  Patient: Damika L Cicero  Procedure(s) Performed: Procedure(s) (LRB): OPEN REDUCTION INTERNAL FIXATION (ORIF) LEFT DISTAL RADIAL FRACTURE (Left)  Patient location during evaluation: PACU Anesthesia Type: Regional and MAC Level of consciousness: awake and alert Pain management: pain level controlled Vital Signs Assessment: post-procedure vital signs reviewed and stable Respiratory status: spontaneous breathing, nonlabored ventilation, respiratory function stable and patient connected to nasal cannula oxygen Cardiovascular status: blood pressure returned to baseline and stable Postop Assessment: no signs of nausea or vomiting Anesthetic complications: no    Last Vitals:  Vitals:   08/16/16 1609 08/16/16 1615  BP:  130/84  Pulse: 90 89  Resp: (!) 21 20  Temp: 36.8 C     Last Pain:  Vitals:   08/16/16 1607  TempSrc:   PainSc: 0-No pain                 Zenaida Deed

## 2016-08-17 ENCOUNTER — Other Ambulatory Visit: Payer: Self-pay | Admitting: Orthopedic Surgery

## 2016-08-17 ENCOUNTER — Encounter (HOSPITAL_BASED_OUTPATIENT_CLINIC_OR_DEPARTMENT_OTHER): Payer: Self-pay | Admitting: Orthopedic Surgery

## 2016-08-17 MED ORDER — OXYCODONE-ACETAMINOPHEN 5-325 MG PO TABS: ORAL_TABLET | ORAL | 0 refills | Status: DC

## 2016-08-17 MED ORDER — OXYCODONE-ACETAMINOPHEN 5-325 MG PO TABS: ORAL_TABLET | ORAL | 0 refills | Status: AC

## 2016-08-17 NOTE — Telephone Encounter (Signed)
Patient/husband called regarding pain control.  Block has worn off and now with throbbing pain.  Have been using percocet 5/325 1/2-1 pill every eight hours.  Discussed splitting dressing and maintaining elevation.  They state she will run out of pain medication tonight.  Prescription for percocet 5/325 1-2 tabs po q6 hours prn pain disp #30 written for patient's husband to pick up.  They want to add some tylenol as well and were instructed to keep the total acetaminophen dose under 4000 mg per day.

## 2016-08-21 NOTE — Addendum Note (Signed)
Addendum  created 08/21/16 [id number] by Jillyn Hidden, MD   Sign clinical note

## 2017-10-10 ENCOUNTER — Other Ambulatory Visit: Payer: Self-pay | Admitting: Obstetrics and Gynecology

## 2017-10-10 DIAGNOSIS — N6002 Solitary cyst of left breast: Secondary | ICD-10-CM

## 2017-10-15 ENCOUNTER — Ambulatory Visit
Admission: RE | Admit: 2017-10-15 | Discharge: 2017-10-15 | Disposition: A | Discharge status: Home / Self Care | Payer: BLUE CROSS/BLUE SHIELD | Source: Ambulatory Visit | Attending: Obstetrics and Gynecology | Admitting: Obstetrics and Gynecology

## 2017-10-15 DIAGNOSIS — N6002 Solitary cyst of left breast: Secondary | ICD-10-CM
